# Patient Record
Sex: Female | Born: 1935 | Race: White | Hispanic: No | Smoking: Former smoker
Health system: Southern US, Community
[De-identification: ages and names within clinical notes are randomized; demographics above are authoritative.]

## PROBLEM LIST (undated history)

## (undated) DIAGNOSIS — I10 Essential (primary) hypertension: Secondary | ICD-10-CM

## (undated) DIAGNOSIS — M1990 Unspecified osteoarthritis, unspecified site: Secondary | ICD-10-CM

## (undated) DIAGNOSIS — E039 Hypothyroidism, unspecified: Secondary | ICD-10-CM

## (undated) HISTORY — PX: BACK SURGERY: SHX140

## (undated) HISTORY — PX: RIGHT OOPHORECTOMY: SHX2359

## (undated) HISTORY — PX: HEEL SPUR SURGERY: SHX665

## (undated) HISTORY — PX: EYE SURGERY: SHX253

## (undated) HISTORY — PX: BREAST SURGERY: SHX581

---

## 2006-04-11 ENCOUNTER — Encounter (INDEPENDENT_AMBULATORY_CARE_PROVIDER_SITE_OTHER): Payer: Self-pay | Admitting: Specialist

## 2006-04-12 ENCOUNTER — Inpatient Hospital Stay (HOSPITAL_COMMUNITY): Admission: RE | Admit: 2006-04-12 | Discharge: 2006-04-14 | Payer: Self-pay | Admitting: Orthopedic Surgery

## 2006-04-13 ENCOUNTER — Ambulatory Visit: Payer: Self-pay | Admitting: Physical Medicine & Rehabilitation

## 2009-11-30 ENCOUNTER — Inpatient Hospital Stay (HOSPITAL_COMMUNITY): Admission: RE | Admit: 2009-11-30 | Discharge: 2009-12-01 | Payer: Self-pay | Admitting: Neurosurgery

## 2010-09-06 LAB — SURGICAL PCR SCREEN: Staphylococcus aureus: NEGATIVE

## 2010-09-06 LAB — CBC
HCT: 42.5 % (ref 36.0–46.0)
MCHC: 33.3 g/dL (ref 30.0–36.0)
MCV: 91 fL (ref 78.0–100.0)
Platelets: 191 10*3/uL (ref 150–400)
RDW: 13.3 % (ref 11.5–15.5)
WBC: 7.6 10*3/uL (ref 4.0–10.5)

## 2010-11-05 NOTE — Op Note (Signed)
[name], Olsen                  ACCOUNT NO.:  [id number]   MEDICAL RECORD NO.:  [id number]          PATIENT TYPE:  AMB   LOCATION:  DAY                          FACILITY:  [facility]   PHYSICIAN:  Deidre Ala, M.D.    DATE OF BIRTH:  [birth year]   DATE OF PROCEDURE:  04/11/2006  DATE OF DISCHARGE:                                 OPERATIVE REPORT   PREOPERATIVE DIAGNOSIS:  1. Left heel Haglund's deformity pump bump.  2. Retrocalcaneal bursitis.  3. Insertional tendinitis with tendinosus and large insertional spur.   POSTOPERATIVE DIAGNOSIS:  1. Left heel Haglund's deformity pump bump.  2. Retrocalcaneal bursitis.  3. Insertional tendinitis with tendinosus and large insertional spur.   PROCEDURE:  1. Excision left heel retrocalcaneal bursa and posterior superior      tuberosity os calcis - Haglund's deformity.  2. Excision of intrasubstance tendinosus and insertional tendinitis left      Achilles tendon.  3. Excision large insertional spur left heel.   SURGEON:  1. [name], M.D.   ASSISTANT:  [name], [facility]   ANESTHESIA:  General endotracheal.   CULTURES:  None.   DRAINS:  None.   TOURNIQUET TIME:  1 hour 45 minutes.   PATHOLOGIC FINDINGS AND HISTORY:  Kristen Olsen is a 77 year old female who has had  chronic problems with a large Haglund's deformity, retrocalcaneal bursitis,  insertional tendinitis with a large insertional spur.  She had been treated  with conservative management by orthopedist in [place]. She can not take  nonsteroidal anti-inflammatories. She has a Cam boot at home. The pain was  constant to moderate, worse with walking.  We discussed the procedure which  was described by Dr. Monica Becton, Chairman of Orthopedics at [facility]  for this procedure.  She was fully aware of the operative heal-up time and  the perioperative parameters.  She is an older person, but is quite active  and felt there was no other conservative management to be done.  At  surgery  there was a huge posterior superior tuberosity Haglund's deformity.  There  was a massive insertional osteophyte within the tendon. There was marked  scarring at the insertion of the Achilles tendon as well as about 3 cm up  which we excised.  We did repair with two four-prong Mitek anchors  reinforcing our reinsertion centrally and wove the #2 Ethibond sutures up  the tendon with a locking suture and then oversewed with a #1 PDS to cover  the knots.  We were able to get her to near neutral but about 10 degrees of  plantar flexion, so we put her in a Cam walker boot with hinges and about 10  degrees of flexion.  We did get rid if a lot of scarred insertional and  intrasubstance tendinitis and removed a very inflamed retrocalcaneal bursa.  We debulked the mass of the tendon and spurring quite significantly at the  insertion.   PROCEDURE:  With adequate anesthesia obtained using endotracheal technique,  1 gram vancomycin IV prophylaxis due to penicillin allergy.  The patient was  placed prone  on chest rolls.  After standard prepping and draping of the  left lower extremity from the toes to the knee.  A curvilinear skin incision  was then made with the flap based lateral to protect the sural nerve and to  not have wound over the insertion site. Incision was deepened sharply with  knife and hemostasis obtained using the Bovie electrocoagulator.  Gentle  dissection was carried out to develop the flap thus exposing the Achilles  tendon.  In the manner of Nunley, a longitudinal incision was then made in  the tendon cutting down to its insertion on the heel.  Careful dissection of  the tendon centrally was carried out exposing the insertional spur as well  as the Haglund's deformity and the retrocalcaneal bursitis.  With osteotomes  and rongeur we removed the Haglund's deformity, posterior superior os calcis  and smoothed it. We then removed the insertional spur centrally and somewhat   medially and laterally removing the bony deformity essentially completely.  This was all smoothed with rongeur. Irrigation was carried out.  I then  drilled the os calcis to stimulate neovascularity with 0.62 K-wire.  I then  placed two four-prong Mitek anchors at the os calcis insertional site and  pulled the tendon down to it on both sides weaving it up inside the tendon  so to not expose suture and pulled it down to the heel.  I then oversewed  this with a running #1 PDS to bury any nonabsorbable suture knots and tied  it to itself within the substance of the tendon.  When I was happy with the  secure tendon fixation and closure, irrigation was carried out and wound was  closed in layers with 3-0 and 4-0 Vicryl subcu and skin staples.  Bulky  sterile compressive dressing was applied with the foot in slight plantar  flexion to be later put in PACU to a Cam Walker boot with the hinges  pointing the foot downward.  The patient having procedure well was awakened,  taken to recovery room in satisfactory condition.  She is to spend the night  for elevation, overnight observation, to be discharged with crutches,  elevation, toe touch weightbearing, and told call the office for appointment  for recheck at the first part of next week.           ______________________________  V. [name], M.D.     VEP/MEDQ  D:  04/11/2006  T:  04/12/2006  Job:  [id number]   cc:   Nadine Counts  Fax: [phone]

## 2010-11-05 NOTE — Discharge Summary (Signed)
[name], Kristen Olsen                  ACCOUNT NO.:  [id number]   MEDICAL RECORD NO.:  [id number]          PATIENT TYPE:  INP   LOCATION:  1507                         FACILITY:  [facility]   PHYSICIAN:  Deidre Ala, M.D.    DATE OF BIRTH:  [birth year]   DATE OF ADMISSION:  04/11/2006  DATE OF DISCHARGE:  04/14/2006                                 DISCHARGE SUMMARY   ADMISSION DIAGNOSES:  1. Hypothyroidism.  2. Left heel Haglund's deformity.   DISCHARGE DIAGNOSES:  1. Hypothyroidism.  2. Haglund's deformity status post left heel Nunley procedure with      excision Haglund's deformity at the os calcis and debridement of the      Achilles' tendon and repair and excision of retrocalcaneal bursa.   PROCEDURE:  The patient was taken to the operating room on April 11, 2006  and underwent left heel Nunley procedure with excision of Haglund's  deformity at the os calcis and debridement of the Achilles' tendinitis and  repair and excision of retrocalcaneal bursa.   SURGEON:  1. [name], M.D.   ASSISTANT:  [name], P.A.-C.   CONSULTATIONS:  1. Physical therapy.  2. Occupational therapy.  3. Physical management and rehabilitation with Dr. Thomasena Edis.   BRIEF HISTORY:  The patient is a 77 year old female with a longstanding  history of heel pain.  She has always had deformity of the left heel.  Radiographs confirmed Haglund's deformity of the left heel.  Upon these  findings and failure of conservative treatment, Dr. [name] felt it was best to  proceed with surgery.  The patient agreed.  The risks and benefits of the  surgery were discussed with the patient and the patient wished to proceed.   LABORATORY DATA:  CBC on admission showed hemoglobin 14.4, hematocrit 42.6,  white blood cell count 9.2, red blood cell count 4.71.  Hemoglobin and  hematocrit were followed up after surgery and remained within normal limits.  Differential on admission were all within normal limits.   Coagulation  studies on admission were all within normal limits.  Routine chemistry on  admission showed sodium slightly high at 146, glucose high at 150.  Follow-  up chemistry showed sodium return to normal range and glucose remained high  at 120.  Urinalysis on admission showed appearance cloudy with trace amount  of nitrite, few epithelials and hyaline cast.  EKG on admission showed sinus  rhythm with first degree AV block possible anterior infarct, age  undetermined.  Preop chest x-ray showed cardiomegaly with no failure,  chronic obstructive pulmonary disease and emphysema and growth specimen  showed left retrocalcaneal cyst with benign fibro adipose tissue is patchy  and nonchronic inflammation of a nonsynovial cyst.   HOSPITAL COURSE:  The patient was admitted to [facility] and taken  to the operating room.  She underwent the above stated procedure without  complications.  The patient tolerated the procedure well.  Allowed to return  to the recovery room and orthopedic floor to continue postoperative care.  On postoperative day #1, the patient complained of nausea and  vomiting.  Hemoglobin 10.7, hematocrit 36.9.  She is neurovascularly intact to the left  lower extremity.  The patient was in a CAM boot and was to work with  physical therapy and occupational therapy.  On postoperative day #2, the  patient was unable to maintain nonweight-bearing status as she lives alone.  She is neurovascularly intact to the left lower extremity.  Good distal  pulses, will remain in a Cam boot.  On postoperative day #3, the patient was  resting comfortably.  Incisions clean, dry and intact.  She remained  neurovascularly intact to the left lower extremity.  Skilled nursing  facility placement was searched at this time and will be discharged to a  skilled nursing facility of her choice.   DISPOSITION:  The patient is discharged to a skilled nursing facility of her  choice.   DISCHARGE  MEDICATIONS:  1. Colace 100 mg p.o. b.i.d.  2. Synthroid 88 mcg p.o. q.d.  3. Laxative of choice 1 unit p.o. p.r.n.  4. Enema of choice, 1 unit p.o. p.r.n.  5. Percocet 5/325 mg, 1-2 p.o. q.4-6h. p.r.n. pain.  6. Tylenol 325 mg to 650 mg p.o. q.4-6h. p.r.n.  7. Reglan 10 mg p.o. q.8h. p.r.n.  8. Phenergan 25 mg p.o. q.6h. p.r.n.  9. Robaxin 500 mg p.o. q.6h. p.r.n.  10.Restoril 30 mg p.o. q.h.s. p.r.n.  11.Benadryl 25 mg p.o. q.h.s. p.r.n.   DIET:  As tolerated.   ACTIVITY:  The patient is nonweightbearing to the left lower extremity.   WOUND CARE:  The patient is to have daily dressing changes performed.   FOLLOWUP:  The patient is to follow up with Dr. [name] two weeks from the day  of surgery.  The office is to be called for an appointment at [phone].   CONDITION ON DISCHARGE:  Stable and improved.     ______________________________  [name], P.A.-C.    ______________________________  V. [name], M.D.    SW/MEDQ  D:  04/14/2006  T:  04/14/2006  Job:  [id number]

## 2013-07-16 ENCOUNTER — Other Ambulatory Visit: Payer: Self-pay | Admitting: Neurosurgery

## 2013-07-16 DIAGNOSIS — M545 Low back pain, unspecified: Secondary | ICD-10-CM

## 2013-07-22 ENCOUNTER — Ambulatory Visit
Admission: RE | Admit: 2013-07-22 | Discharge: 2013-07-22 | Disposition: A | Discharge status: Home / Self Care | Payer: Medicare HMO | Source: Ambulatory Visit | Attending: Neurosurgery | Admitting: Neurosurgery

## 2013-07-22 DIAGNOSIS — M545 Low back pain, unspecified: Secondary | ICD-10-CM

## 2013-07-22 MED ORDER — METHYLPREDNISOLONE ACETATE 40 MG/ML INJ SUSP (RADIOLOG
120.0000 mg | Freq: Once | INTRAMUSCULAR | Status: AC
  Administered 2013-07-22: 120 mg via EPIDURAL

## 2013-07-22 MED ORDER — IOHEXOL 180 MG/ML  SOLN
1.0000 mL | Freq: Once | INTRAMUSCULAR | Status: AC | PRN
  Administered 2013-07-22: 1 mL via EPIDURAL

## 2013-07-22 NOTE — Discharge Instructions (Signed)

## 2013-08-22 ENCOUNTER — Other Ambulatory Visit: Payer: Self-pay | Admitting: Neurosurgery

## 2013-08-22 DIAGNOSIS — M545 Low back pain, unspecified: Secondary | ICD-10-CM

## 2013-08-26 ENCOUNTER — Ambulatory Visit
Admission: RE | Admit: 2013-08-26 | Discharge: 2013-08-26 | Disposition: A | Discharge status: Home / Self Care | Payer: Medicare HMO | Source: Ambulatory Visit | Attending: Neurosurgery | Admitting: Neurosurgery

## 2013-08-26 DIAGNOSIS — M545 Low back pain, unspecified: Secondary | ICD-10-CM

## 2013-08-26 MED ORDER — METHYLPREDNISOLONE ACETATE 40 MG/ML INJ SUSP (RADIOLOG
120.0000 mg | Freq: Once | INTRAMUSCULAR | Status: AC
  Administered 2013-08-26: 120 mg via EPIDURAL

## 2013-08-26 MED ORDER — IOHEXOL 180 MG/ML  SOLN
1.0000 mL | Freq: Once | INTRAMUSCULAR | Status: AC | PRN
  Administered 2013-08-26: 1 mL via EPIDURAL

## 2013-10-22 ENCOUNTER — Other Ambulatory Visit: Payer: Self-pay | Admitting: Neurosurgery

## 2013-10-23 ENCOUNTER — Encounter (HOSPITAL_COMMUNITY): Payer: Self-pay | Admitting: Pharmacy Technician

## 2013-10-25 ENCOUNTER — Encounter (HOSPITAL_COMMUNITY)
Admission: RE | Admit: 2013-10-25 | Discharge: 2013-10-25 | Disposition: A | Discharge status: Home / Self Care | Payer: Medicare HMO | Source: Ambulatory Visit | Attending: Neurosurgery | Admitting: Neurosurgery

## 2013-10-25 ENCOUNTER — Encounter (HOSPITAL_COMMUNITY): Payer: Self-pay

## 2013-10-25 ENCOUNTER — Ambulatory Visit (HOSPITAL_COMMUNITY)
Admission: RE | Admit: 2013-10-25 | Discharge: 2013-10-25 | Disposition: A | Discharge status: Home / Self Care | Payer: Medicare HMO | Source: Ambulatory Visit | Attending: Anesthesiology | Admitting: Anesthesiology

## 2013-10-25 DIAGNOSIS — Z01812 Encounter for preprocedural laboratory examination: Secondary | ICD-10-CM | POA: Insufficient documentation

## 2013-10-25 DIAGNOSIS — Z01810 Encounter for preprocedural cardiovascular examination: Secondary | ICD-10-CM | POA: Insufficient documentation

## 2013-10-25 DIAGNOSIS — Z01818 Encounter for other preprocedural examination: Secondary | ICD-10-CM | POA: Insufficient documentation

## 2013-10-25 HISTORY — DX: Essential (primary) hypertension: I10

## 2013-10-25 HISTORY — DX: Hypothyroidism, unspecified: E03.9

## 2013-10-25 HISTORY — DX: Unspecified osteoarthritis, unspecified site: M19.90

## 2013-10-25 LAB — BASIC METABOLIC PANEL
BUN: 13 mg/dL (ref 6–23)
CHLORIDE: 101 meq/L (ref 96–112)
CO2: 25 meq/L (ref 19–32)
Calcium: 9.7 mg/dL (ref 8.4–10.5)
Creatinine, Ser: 0.7 mg/dL (ref 0.50–1.10)
GFR calc Af Amer: 90 mL/min — ABNORMAL LOW (ref >90)
GFR calc non Af Amer: 77 mL/min — ABNORMAL LOW (ref >90)
Glucose, Bld: 88 mg/dL (ref 70–99)
POTASSIUM: 4 meq/L (ref 3.7–5.3)
SODIUM: 138 meq/L (ref 137–147)

## 2013-10-25 LAB — CBC
HEMATOCRIT: 42.8 % (ref 36.0–46.0)
HEMOGLOBIN: 14.3 g/dL (ref 12.0–15.0)
MCH: 30.2 pg (ref 26.0–34.0)
MCHC: 33.4 g/dL (ref 30.0–36.0)
MCV: 90.5 fL (ref 78.0–100.0)
Platelets: 204 10*3/uL (ref 150–400)
RBC: 4.73 MIL/uL (ref 3.87–5.11)
RDW: 13.9 % (ref 11.5–15.5)
WBC: 6 10*3/uL (ref 4.0–10.5)

## 2013-10-25 LAB — SURGICAL PCR SCREEN
MRSA, PCR: NEGATIVE
STAPHYLOCOCCUS AUREUS: NEGATIVE

## 2013-10-25 NOTE — Pre-Procedure Instructions (Signed)
Kristen Olsen  10/25/2013   Your procedure is scheduled on:  Monday, May 11th   [facility] at  9:22 AM.   Call this number if you have problems the morning of surgery: [phone]   Remember:   Do not eat food or drink liquids after midnight Sunday.   Take these medicines the morning of surgery with A SIP OF WATER: Norvasc, Levothyroxine   Do not wear jewelry, make-up or nail polish.  Do not wear lotions, powders, or perfumes. You may NOT wear deodorant.  Do not shave underarms & legs 48 hours prior to surgery.    Do not bring valuables to the hospital.  [facility] is not responsible for any belongings or valuables.               Contacts, dentures or bridgework may not be worn into surgery.  Leave suitcase in the car. After surgery it may be brought to your room.  For patients admitted to the hospital, discharge time is determined by your treatment team.              Name and phone number of your driver:    Special Instructions: "Preparing for Surgery" instruction sheet.   Please read over the following fact sheets that you were given: Pain Booklet, MRSA Information and Surgical Site Infection Prevention

## 2013-10-25 NOTE — Progress Notes (Signed)
Anesthesia Chart Review:  Patient is a 80 year old female scheduled for left L4-5, L5-S1 microdiscectomy on 10/28/13 by Dr. Wynetta Emeryram. PAT was on Friday, chart given to me to review at 4 PM.  History includes former smoker, hypothyroidism, HTN, arthritis, back surgery, breast cyst excision. PCP is listed as Dr. Foye Deerouglas Schultz.  EKG on 10/25/13 showed SR with first degree AVB, PACs, LAD, pulmonary disease pattern, non-specific ST/T wave abnormality.  Poor precordial r wave progression.  Low r waves in inferior leads.  Overall, I think the EKG appears stable when compared to prior tracing on 11/25/09 (which was done prior to a redo laminectomy/microdiskectomy). No CV symptoms were documented from her PAT visit.  Preoperative CXR and labs noted.  Further evaluation by her assigned anesthesiologist on the day of surgery.  I think her EKG appears stable since 2011, so if no acute changes or new CV symptoms then I would anticipate that she could proceed as planned.  Velna Ochsllison Roosvelt Churchwell, PA-C [facility]/Anesthesiology Phone [phone] 10/25/2013 4:06 PM

## 2013-10-25 NOTE — Progress Notes (Signed)
Ms. Kristen StallingMarley maybe 'sensitive' to the chlorhexadine.  She stated that she cannot use perfume products, lotion, soap, etc as her skin will turn red.  I advised her that when she uses the soap to clean off, be extra attentive to, if any 'side effects' from the soap.  A trial run was done here in the PAT room...knuckles got alittle red, but no hives or rash, or itching noted.   DA

## 2013-10-28 ENCOUNTER — Encounter (HOSPITAL_COMMUNITY)
Admission: RE | Disposition: A | Discharge status: Home / Self Care | Payer: Self-pay | Source: Ambulatory Visit | Attending: Neurosurgery

## 2013-10-28 ENCOUNTER — Inpatient Hospital Stay (HOSPITAL_COMMUNITY)
Admission: RE | Admit: 2013-10-28 | Discharge: 2013-10-29 | DRG: 520 | Disposition: A | Discharge status: Home / Self Care | Payer: Medicare HMO | Source: Ambulatory Visit | Attending: Neurosurgery | Admitting: Neurosurgery

## 2013-10-28 ENCOUNTER — Encounter (HOSPITAL_COMMUNITY): Payer: Medicare HMO | Admitting: Vascular Surgery

## 2013-10-28 ENCOUNTER — Inpatient Hospital Stay (HOSPITAL_COMMUNITY): Payer: Medicare HMO

## 2013-10-28 ENCOUNTER — Encounter (HOSPITAL_COMMUNITY): Payer: Self-pay | Admitting: Anesthesiology

## 2013-10-28 ENCOUNTER — Inpatient Hospital Stay (HOSPITAL_COMMUNITY): Payer: Medicare HMO | Admitting: Anesthesiology

## 2013-10-28 DIAGNOSIS — Z79899 Other long term (current) drug therapy: Secondary | ICD-10-CM

## 2013-10-28 DIAGNOSIS — E039 Hypothyroidism, unspecified: Secondary | ICD-10-CM | POA: Diagnosis present

## 2013-10-28 DIAGNOSIS — M129 Arthropathy, unspecified: Secondary | ICD-10-CM | POA: Diagnosis present

## 2013-10-28 DIAGNOSIS — M5126 Other intervertebral disc displacement, lumbar region: Principal | ICD-10-CM | POA: Diagnosis present

## 2013-10-28 DIAGNOSIS — I10 Essential (primary) hypertension: Secondary | ICD-10-CM | POA: Diagnosis present

## 2013-10-28 DIAGNOSIS — Z87891 Personal history of nicotine dependence: Secondary | ICD-10-CM

## 2013-10-28 DIAGNOSIS — M48061 Spinal stenosis, lumbar region without neurogenic claudication: Secondary | ICD-10-CM | POA: Diagnosis present

## 2013-10-28 HISTORY — PX: LUMBAR LAMINECTOMY/DECOMPRESSION MICRODISCECTOMY: SHX5026

## 2013-10-28 SURGERY — LUMBAR LAMINECTOMY/DECOMPRESSION MICRODISCECTOMY 2 LEVELS
Anesthesia: General | Site: Back | Laterality: Left

## 2013-10-28 MED ORDER — ARTIFICIAL TEARS OP OINT
TOPICAL_OINTMENT | OPHTHALMIC | Status: DC | PRN
  Administered 2013-10-28: 1 via OPHTHALMIC

## 2013-10-28 MED ORDER — GLYCOPYRROLATE 0.2 MG/ML IJ SOLN
INTRAMUSCULAR | Status: AC
  Filled 2013-10-28: qty 3

## 2013-10-28 MED ORDER — PHENOL 1.4 % MT LIQD: 1.0000 | OROMUCOSAL | Status: DC | PRN

## 2013-10-28 MED ORDER — BUPIVACAINE HCL (PF) 0.25 % IJ SOLN
INTRAMUSCULAR | Status: DC | PRN
  Administered 2013-10-28: 10 mL

## 2013-10-28 MED ORDER — LACTATED RINGERS IV SOLN
INTRAVENOUS | Status: DC | PRN
  Administered 2013-10-28: 07:00:00 via INTRAVENOUS

## 2013-10-28 MED ORDER — FENTANYL CITRATE 0.05 MG/ML IJ SOLN
INTRAMUSCULAR | Status: AC
  Filled 2013-10-28: qty 5

## 2013-10-28 MED ORDER — THROMBIN 5000 UNITS EX SOLR
CUTANEOUS | Status: DC | PRN
  Administered 2013-10-28 (×2): 5000 [IU] via TOPICAL

## 2013-10-28 MED ORDER — LIDOCAINE HCL (CARDIAC) 20 MG/ML IV SOLN
INTRAVENOUS | Status: DC | PRN
Start: 2013-10-28 — End: 2013-10-28
  Administered 2013-10-28: 100 mg via INTRAVENOUS

## 2013-10-28 MED ORDER — HAIR/SKIN/NAILS/BIOTIN PO TABS: 1.0000 | ORAL_TABLET | Freq: Every day | ORAL | Status: DC

## 2013-10-28 MED ORDER — MENTHOL 3 MG MT LOZG
1.0000 | LOZENGE | OROMUCOSAL | Status: DC | PRN
  Administered 2013-10-28: 3 mg via ORAL
  Filled 2013-10-28: qty 9

## 2013-10-28 MED ORDER — VANCOMYCIN HCL 10 G IV SOLR
1250.0000 mg | Freq: Once | INTRAVENOUS | Status: AC
  Administered 2013-10-28: 1250 mg via INTRAVENOUS
  Filled 2013-10-28: qty 1250

## 2013-10-28 MED ORDER — SODIUM CHLORIDE 0.9 % IJ SOLN: 3.0000 mL | INTRAMUSCULAR | Status: DC | PRN

## 2013-10-28 MED ORDER — LIDOCAINE-EPINEPHRINE 1 %-1:100000 IJ SOLN
INTRAMUSCULAR | Status: DC | PRN
  Administered 2013-10-28: 9 mL

## 2013-10-28 MED ORDER — ONDANSETRON HCL 4 MG/2ML IJ SOLN
INTRAMUSCULAR | Status: DC | PRN
  Administered 2013-10-28: 4 mg via INTRAVENOUS

## 2013-10-28 MED ORDER — ROCURONIUM BROMIDE 100 MG/10ML IV SOLN
INTRAVENOUS | Status: DC | PRN
  Administered 2013-10-28: 10 mg via INTRAVENOUS
  Administered 2013-10-28: 40 mg via INTRAVENOUS

## 2013-10-28 MED ORDER — MIDAZOLAM HCL 5 MG/5ML IJ SOLN
INTRAMUSCULAR | Status: DC | PRN
  Administered 2013-10-28: 1 mg via INTRAVENOUS

## 2013-10-28 MED ORDER — HYDROMORPHONE HCL PF 1 MG/ML IJ SOLN: 0.5000 mg | INTRAMUSCULAR | Status: DC | PRN

## 2013-10-28 MED ORDER — ONDANSETRON HCL 4 MG/2ML IJ SOLN
INTRAMUSCULAR | Status: AC
  Filled 2013-10-28: qty 2

## 2013-10-28 MED ORDER — LIDOCAINE HCL (CARDIAC) 20 MG/ML IV SOLN
INTRAVENOUS | Status: AC
Start: 2013-10-28 — End: 2013-10-28
  Filled 2013-10-28: qty 5

## 2013-10-28 MED ORDER — SUCCINYLCHOLINE CHLORIDE 20 MG/ML IJ SOLN
INTRAMUSCULAR | Status: AC
  Filled 2013-10-28: qty 1

## 2013-10-28 MED ORDER — HEMOSTATIC AGENTS (NO CHARGE) OPTIME
TOPICAL | Status: DC | PRN
  Administered 2013-10-28: 1 via TOPICAL

## 2013-10-28 MED ORDER — PHENYLEPHRINE HCL 10 MG/ML IJ SOLN
INTRAMUSCULAR | Status: DC | PRN
  Administered 2013-10-28: 40 ug via INTRAVENOUS

## 2013-10-28 MED ORDER — AMLODIPINE BESYLATE 5 MG PO TABS
5.0000 mg | ORAL_TABLET | Freq: Every day | ORAL | Status: DC
  Administered 2013-10-29: 5 mg via ORAL
  Filled 2013-10-28: qty 1

## 2013-10-28 MED ORDER — ACETAMINOPHEN 500 MG PO TABS
1000.0000 mg | ORAL_TABLET | Freq: Every day | ORAL | Status: DC
  Administered 2013-10-28 – 2013-10-29 (×2): 1000 mg via ORAL
  Filled 2013-10-28 (×2): qty 2

## 2013-10-28 MED ORDER — NEOSTIGMINE METHYLSULFATE 10 MG/10ML IV SOLN
INTRAVENOUS | Status: AC
  Filled 2013-10-28: qty 1

## 2013-10-28 MED ORDER — NEOSTIGMINE METHYLSULFATE 10 MG/10ML IV SOLN
INTRAVENOUS | Status: DC | PRN
  Administered 2013-10-28: 1 mg via INTRAVENOUS
  Administered 2013-10-28: 4 mg via INTRAVENOUS

## 2013-10-28 MED ORDER — CYCLOBENZAPRINE HCL 10 MG PO TABS: 10.0000 mg | ORAL_TABLET | Freq: Three times a day (TID) | ORAL | Status: DC | PRN

## 2013-10-28 MED ORDER — 0.9 % SODIUM CHLORIDE (POUR BTL) OPTIME
TOPICAL | Status: DC | PRN
  Administered 2013-10-28: 1000 mL

## 2013-10-28 MED ORDER — VANCOMYCIN HCL IN DEXTROSE 1-5 GM/200ML-% IV SOLN
INTRAVENOUS | Status: AC
  Administered 2013-10-28: 1000 mg via INTRAVENOUS
  Filled 2013-10-28: qty 200

## 2013-10-28 MED ORDER — SODIUM CHLORIDE 0.9 % IR SOLN
Status: DC | PRN
  Administered 2013-10-28: 08:00:00

## 2013-10-28 MED ORDER — FENTANYL CITRATE 0.05 MG/ML IJ SOLN: 25.0000 ug | INTRAMUSCULAR | Status: DC | PRN

## 2013-10-28 MED ORDER — DOCUSATE SODIUM 100 MG PO CAPS
100.0000 mg | ORAL_CAPSULE | Freq: Two times a day (BID) | ORAL | Status: DC
  Administered 2013-10-28 – 2013-10-29 (×2): 100 mg via ORAL
  Filled 2013-10-28 (×4): qty 1

## 2013-10-28 MED ORDER — GLYCOPYRROLATE 0.2 MG/ML IJ SOLN
INTRAMUSCULAR | Status: DC | PRN
  Administered 2013-10-28: 0.2 mg via INTRAVENOUS
  Administered 2013-10-28: .6 mg via INTRAVENOUS

## 2013-10-28 MED ORDER — MIDAZOLAM HCL 2 MG/2ML IJ SOLN
INTRAMUSCULAR | Status: AC
  Filled 2013-10-28: qty 2

## 2013-10-28 MED ORDER — FENTANYL CITRATE 0.05 MG/ML IJ SOLN
INTRAMUSCULAR | Status: DC | PRN
Start: 2013-10-28 — End: 2013-10-28
  Administered 2013-10-28: 50 ug via INTRAVENOUS
  Administered 2013-10-28: 25 ug via INTRAVENOUS

## 2013-10-28 MED ORDER — ONDANSETRON HCL 4 MG/2ML IJ SOLN: 4.0000 mg | INTRAMUSCULAR | Status: DC | PRN

## 2013-10-28 MED ORDER — ACETAMINOPHEN 650 MG RE SUPP: 650.0000 mg | RECTAL | Status: DC | PRN

## 2013-10-28 MED ORDER — LEVOTHYROXINE SODIUM 88 MCG PO TABS
88.0000 ug | ORAL_TABLET | Freq: Every day | ORAL | Status: DC
  Administered 2013-10-29: 88 ug via ORAL
  Filled 2013-10-28 (×2): qty 1

## 2013-10-28 MED ORDER — EPHEDRINE SULFATE 50 MG/ML IJ SOLN
INTRAMUSCULAR | Status: DC | PRN
  Administered 2013-10-28 (×2): 10 mg via INTRAVENOUS

## 2013-10-28 MED ORDER — CEFAZOLIN SODIUM 1-5 GM-% IV SOLN
1.0000 g | Freq: Three times a day (TID) | INTRAVENOUS | Status: DC
Start: 2013-10-28 — End: 2013-10-28

## 2013-10-28 MED ORDER — ACETAMINOPHEN 325 MG PO TABS
650.0000 mg | ORAL_TABLET | ORAL | Status: DC | PRN
  Administered 2013-10-28: 650 mg via ORAL
  Filled 2013-10-28: qty 2

## 2013-10-28 MED ORDER — PROPOFOL 10 MG/ML IV BOLUS
INTRAVENOUS | Status: AC
  Filled 2013-10-28: qty 20

## 2013-10-28 MED ORDER — SODIUM CHLORIDE 0.9 % IV SOLN: 250.0000 mL | INTRAVENOUS | Status: DC

## 2013-10-28 MED ORDER — ROCURONIUM BROMIDE 50 MG/5ML IV SOLN
INTRAVENOUS | Status: AC
  Filled 2013-10-28: qty 1

## 2013-10-28 MED ORDER — EPHEDRINE SULFATE 50 MG/ML IJ SOLN
INTRAMUSCULAR | Status: AC
  Filled 2013-10-28: qty 1

## 2013-10-28 MED ORDER — HYDROCODONE-ACETAMINOPHEN 5-325 MG PO TABS: 1.0000 | ORAL_TABLET | ORAL | Status: DC | PRN

## 2013-10-28 MED ORDER — PROPOFOL 10 MG/ML IV BOLUS
INTRAVENOUS | Status: DC | PRN
  Administered 2013-10-28: 130 mg via INTRAVENOUS

## 2013-10-28 MED ORDER — ALUM & MAG HYDROXIDE-SIMETH 200-200-20 MG/5ML PO SUSP: 30.0000 mL | Freq: Four times a day (QID) | ORAL | Status: DC | PRN

## 2013-10-28 MED ORDER — SODIUM CHLORIDE 0.9 % IJ SOLN
3.0000 mL | Freq: Two times a day (BID) | INTRAMUSCULAR | Status: DC
  Administered 2013-10-28 (×2): 3 mL via INTRAVENOUS

## 2013-10-28 SURGICAL SUPPLY — 64 items
ADH SKN CLS APL DERMABOND .7 (GAUZE/BANDAGES/DRESSINGS) ×1
APL SKNCLS STERI-STRIP NONHPOA (GAUZE/BANDAGES/DRESSINGS) ×1
BAG DECANTER FOR FLEXI CONT (MISCELLANEOUS) ×2 IMPLANT
BENZOIN TINCTURE PRP APPL 2/3 (GAUZE/BANDAGES/DRESSINGS) ×2 IMPLANT
BLADE 10 SAFETY STRL DISP (BLADE) ×1 IMPLANT
BLADE SURG 11 STRL SS (BLADE) ×2 IMPLANT
BLADE SURG ROTATE 9660 (MISCELLANEOUS) IMPLANT
BRUSH SCRUB EZ PLAIN DRY (MISCELLANEOUS) ×2 IMPLANT
BUR MATCHSTICK NEURO 3.0 LAGG (BURR) ×2 IMPLANT
BUR PRECISION FLUTE 6.0 (BURR) ×2 IMPLANT
CANISTER SUCT 3000ML (MISCELLANEOUS) ×2 IMPLANT
CONT SPEC 4OZ CLIKSEAL STRL BL (MISCELLANEOUS) ×2 IMPLANT
DECANTER SPIKE VIAL GLASS SM (MISCELLANEOUS) ×1 IMPLANT
DERMABOND ADVANCED (GAUZE/BANDAGES/DRESSINGS) ×1
DERMABOND ADVANCED .7 DNX12 (GAUZE/BANDAGES/DRESSINGS) ×1 IMPLANT
DRAPE LAPAROTOMY 100X72X124 (DRAPES) ×2 IMPLANT
DRAPE MICROSCOPE ZEISS OPMI (DRAPES) ×2 IMPLANT
DRAPE POUCH INSTRU U-SHP 10X18 (DRAPES) ×2 IMPLANT
DRAPE PROXIMA HALF (DRAPES) IMPLANT
DRAPE SURG 17X23 STRL (DRAPES) ×2 IMPLANT
DRSG OPSITE 4X5.5 SM (GAUZE/BANDAGES/DRESSINGS) ×1 IMPLANT
DRSG OPSITE POSTOP 3X4 (GAUZE/BANDAGES/DRESSINGS) ×1 IMPLANT
DURAPREP 26ML APPLICATOR (WOUND CARE) ×2 IMPLANT
ELECT REM PT RETURN 9FT ADLT (ELECTROSURGICAL) ×2
ELECTRODE REM PT RTRN 9FT ADLT (ELECTROSURGICAL) ×1 IMPLANT
GAUZE SPONGE 4X4 16PLY XRAY LF (GAUZE/BANDAGES/DRESSINGS) IMPLANT
GLOVE BIO SURGEON STRL SZ8 (GLOVE) ×2 IMPLANT
GLOVE BIOGEL PI IND STRL 7.0 (GLOVE) IMPLANT
GLOVE BIOGEL PI IND STRL 8 (GLOVE) IMPLANT
GLOVE BIOGEL PI INDICATOR 7.0 (GLOVE) ×1
GLOVE BIOGEL PI INDICATOR 8 (GLOVE) ×1
GLOVE ECLIPSE 7.5 STRL STRAW (GLOVE) ×1 IMPLANT
GLOVE EXAM NITRILE LRG STRL (GLOVE) IMPLANT
GLOVE EXAM NITRILE MD LF STRL (GLOVE) ×1 IMPLANT
GLOVE EXAM NITRILE XL STR (GLOVE) IMPLANT
GLOVE EXAM NITRILE XS STR PU (GLOVE) IMPLANT
GLOVE INDICATOR 8.5 STRL (GLOVE) ×2 IMPLANT
GLOVE SURG SS PI 6.5 STRL IVOR (GLOVE) ×2 IMPLANT
GOWN BRE IMP SLV AUR LG STRL (GOWN DISPOSABLE) ×1 IMPLANT
GOWN BRE IMP SLV AUR XL STRL (GOWN DISPOSABLE) ×2 IMPLANT
GOWN STRL REIN 2XL LVL4 (GOWN DISPOSABLE) IMPLANT
GOWN STRL REUS W/ TWL LRG LVL3 (GOWN DISPOSABLE) IMPLANT
GOWN STRL REUS W/ TWL XL LVL3 (GOWN DISPOSABLE) IMPLANT
GOWN STRL REUS W/TWL LRG LVL3 (GOWN DISPOSABLE) ×2
GOWN STRL REUS W/TWL XL LVL3 (GOWN DISPOSABLE) ×4
KIT BASIN OR (CUSTOM PROCEDURE TRAY) ×2 IMPLANT
KIT ROOM TURNOVER OR (KITS) ×2 IMPLANT
NDL SPNL 22GX3.5 QUINCKE BK (NEEDLE) ×1 IMPLANT
NEEDLE HYPO 22GX1.5 SAFETY (NEEDLE) ×2 IMPLANT
NEEDLE SPNL 22GX3.5 QUINCKE BK (NEEDLE) ×2 IMPLANT
NS IRRIG 1000ML POUR BTL (IV SOLUTION) ×2 IMPLANT
PACK LAMINECTOMY NEURO (CUSTOM PROCEDURE TRAY) ×2 IMPLANT
RUBBERBAND STERILE (MISCELLANEOUS) ×4 IMPLANT
SPONGE GAUZE 4X4 12PLY (GAUZE/BANDAGES/DRESSINGS) ×1 IMPLANT
SPONGE SURGIFOAM ABS GEL SZ50 (HEMOSTASIS) ×2 IMPLANT
STRIP CLOSURE SKIN 1/2X4 (GAUZE/BANDAGES/DRESSINGS) ×2 IMPLANT
SUT VIC AB 0 CT1 18XCR BRD8 (SUTURE) ×1 IMPLANT
SUT VIC AB 0 CT1 8-18 (SUTURE) ×2
SUT VIC AB 2-0 CT1 18 (SUTURE) ×2 IMPLANT
SUT VICRYL 4-0 PS2 18IN ABS (SUTURE) ×2 IMPLANT
SYR 20ML ECCENTRIC (SYRINGE) ×2 IMPLANT
TOWEL OR 17X24 6PK STRL BLUE (TOWEL DISPOSABLE) ×2 IMPLANT
TOWEL OR 17X26 10 PK STRL BLUE (TOWEL DISPOSABLE) ×2 IMPLANT
WATER STERILE IRR 1000ML POUR (IV SOLUTION) ×2 IMPLANT

## 2013-10-28 NOTE — Transfer of Care (Signed)
Immediate Anesthesia Transfer of Care Note  Patient: Diannia RuderLou K Feldhaus  Procedure(s) Performed: Procedure(s) with comments: LUMBAR LAMINECTOMY/DECOMPRESSION MICRODISCECTOMY 2 LEVELS (Left) - Lumbar Four-Five Decompression and Lumbar Five-Sacral One Decompression and Diskectomy  Patient Location: PACU  Anesthesia Type:General  Level of Consciousness: awake and alert   Airway & Oxygen Therapy: Patient Spontanous Breathing and Patient connected to nasal cannula oxygen  Post-op Assessment: Report given to PACU RN and Post -op Vital signs reviewed and stable  Post vital signs: Reviewed and stable  Complications: No apparent anesthesia complications

## 2013-10-28 NOTE — Op Note (Signed)
Preoperative diagnosis: And a left L5-S1 radiculopathy from lumbar spinal stenosis and herniated nucleus pulposus L5-S1 left  Postoperative diagnosis: Same  Procedure: Decompressive lumbar laminectomy L5-S1 with microdissection of the S1 nerve root microscopic discectomy L5-S1 with extension of the laminotomy of the inferior aspect of the L4-5 disc space and foraminotomy a left L4 nerve root  Surgeon: Jillyn HiddenGary Aeron Lheureux  Assistant: Shirlean Kellyobert Nudelman  Anesthesia: Gen.  EBL: Minimal  History of present illness: Patient is a 80 year female is a progress worsening back and left leg pain rate in the back or leg back of her calf heel into the S1 or foot and the third and fourth toes. Spell to be consistent with an S1 radiculopathy however she did have some stenosis in the inferior aspect of the 4-5 disc space on the 5 root so therefore recommended a laminectomy and discectomy at L5-S1 with extension up and decompression of the interest of the L5 nerve root. Patient failed all forms service who with anti-inflammatories narcotic pain management physical therapy and epidural steroid injections.  I extensively went over the risks and benefits of the operation the patient as well as perioperative course and expectations of outcome and alternatives to surgery and she understood and agreed to proceed forward.  Operative procedure: Patient brought into the or was induced under general anesthesia positioned prone the Wilson frame her back was prepped and draped in routine sterile fashion her old incision was opened up and dissection was carried down on the left side of the lamina of L4-5 and S1 interoperative x-ray confirmed identification appropriate level so than bursal the entire lamina of L5 was drilled down a high-speed drill the inferior aspect of the 45 facet and the super aspect of the L5-S1 facet as well as super aspect the S1 lamina. Then using a 3 minute Kerrison punch complete laminectomies and left-sided L5-S1  extension up into the 45 disc space and down with removal of the superior aspect lamina of S1 be a facetectomy was performed and then under microscopic illumination the S1 pedicle was identified the S1 nerve root was unroofed removing a large spur coming off the medial aspect of the facet complex causing dorsal compression of the S1 nerve root. The ligament was a markedly hypertrophied this was also removed piecemeal fashion this helped complete the foraminotomy of S1 margins superiorly to marked subcutaneous just above the L5 pedicle identified the L5 nerve root and decompressed spur coming off the superior aspect of the facet complex at L5-S1 the interest of L4-5 and completed the foraminotomies L5. At this point and the rest of illumination after menisci to the disc space at L5-S1 this was a markedly bulging and displacing the ventral aspect the S1 nerve root so after appear for veins are coagulated the annulotomy was made with lumbar scalpel the spaces and cleanout pituitary rongeurs and Epstein curettes and meniscectomies were further stenosis both foramina were widely patent and easily excepting a coronary.and hockey-stick. Meticulous in a stasis was maintained the scope was irrigated and the wounds closed in layers with after Vicryl and the skin was closed with a running 4 subcuticular benzoin and Steri-Strips were applied as well as Dermabond patient recovered in stable condition. At the end of the case all needle counts and sponge counts were correct the

## 2013-10-28 NOTE — Plan of Care (Signed)
Problem: Consults Goal: Diagnosis - Spinal Surgery Outcome: Completed/Met Date Met:  10/28/13 Lumbar Laminectomy (Complex)

## 2013-10-28 NOTE — Anesthesia Postprocedure Evaluation (Signed)
  Anesthesia Post-op Note  Patient: Kristen Olsen  Procedure(s) Performed: Procedure(s) with comments: LUMBAR LAMINECTOMY/DECOMPRESSION MICRODISCECTOMY 2 LEVELS (Left) - Lumbar Four-Five Decompression and Lumbar Five-Sacral One Decompression and Diskectomy  Patient Location: PACU  Anesthesia Type:General  Level of Consciousness: awake  Airway and Oxygen Therapy: Patient Spontanous Breathing  Post-op Pain: mild  Post-op Assessment: Post-op Vital signs reviewed   Post-op Vital Signs: Reviewed  Last Vitals:  Filed Vitals:   10/28/13 0923  BP: 102/33  Pulse: 59  Temp:   Resp: 16    Complications: No apparent anesthesia complications

## 2013-10-28 NOTE — Anesthesia Preprocedure Evaluation (Addendum)
Anesthesia Evaluation  Patient identified by MRN, date of birth, ID band Patient awake    Reviewed: Allergy & Precautions, H&P , NPO status , Patient's Chart, lab work & pertinent test results  Airway Mallampati: II TM Distance: >3 FB Neck ROM: Full    Dental  (+) Teeth Intact, Dental Advisory Given   Pulmonary former smoker,          Cardiovascular hypertension, Pt. on medications     Neuro/Psych    GI/Hepatic negative GI ROS, Neg liver ROS,   Endo/Other  Hypothyroidism   Renal/GU negative Renal ROS     Musculoskeletal   Abdominal   Peds  Hematology   Anesthesia Other Findings   Reproductive/Obstetrics                          Anesthesia Physical Anesthesia Plan  ASA: III  Anesthesia Plan: General   Post-op Pain Management:    Induction: Intravenous  Airway Management Planned: Oral ETT  Additional Equipment:   Intra-op Plan:   Post-operative Plan: Extubation in OR  Informed Consent: I have reviewed the patients History and Physical, chart, labs and discussed the procedure including the risks, benefits and alternatives for the proposed anesthesia with the patient or authorized representative who has indicated his/her understanding and acceptance.   Dental advisory given  Plan Discussed with: CRNA, Anesthesiologist and Surgeon  Anesthesia Plan Comments:         Anesthesia Quick Evaluation

## 2013-10-28 NOTE — H&P (Signed)
Kristen RuderLou K Trela is an 80 y.o. female.   Chief Complaint: Back and left leg pain HPI: Patient is a very pleasant 80 year old female is a previous laminotomies on the right L4-5 and L5-S1 who has done very well however last several months of progressive worsening left leg pain rating down and predominantly what seems like an S1 distribution back of her leg back or Outside of her foot and the third and fourth toes. She denies any pain and top big toe she does have numbness tingling and same distribution. Workup has shown foraminal stenosis at L4-5 and L5-S1 she been through physical therapy epidural steroid injections and has had limited temporary success from all that. Due to her progression of clinical syndrome and imaging findings today conservative treatment I recommended laminectomy discectomy at L5-S1 as well as extension up to the inferior aspect the L4-5 disc space to decompress the proximal L5 nerve root. I have extensively reviewed the risks and benefits of the operation the patient as well as perioperative course and expectations of outcome and alternatives of surgery she understands and agrees to proceed forward.  Past Medical History  Diagnosis Date  . Hypothyroidism   . Hypertension   . Arthritis     Past Surgical History  Procedure Laterality Date  . Back surgery      x 2  . Right oophorectomy    . Breast surgery      right breast cysts removed  . Eye surgery      bil  cataracts  . Heel spur surgery      LEFT HEEL    No family history on file. Social History:  reports that she quit smoking about 30 years ago. Her smoking use included Cigarettes. She has a 30.75 pack-year smoking history. She has never used smokeless tobacco. She reports that she does not drink alcohol or use illicit drugs.  Allergies:  Allergies  Allergen Reactions  . Penicillins Rash    Medications Prior to Admission  Medication Sig Dispense Refill  . acetaminophen (TYLENOL) 500 MG tablet Take 1,000 mg by  mouth daily. scheduled      . amLODipine (NORVASC) 5 MG tablet Take 5 mg by mouth daily.      Marland Kitchen. levothyroxine (SYNTHROID, LEVOTHROID) 88 MCG tablet Take 88 mcg by mouth daily before breakfast.      . Multiple Vitamins-Minerals (HAIR/SKIN/NAILS/BIOTIN) TABS Take 1 tablet by mouth daily.        No results found for this or any previous visit (from the past 48 hour(s)). No results found.  Review of Systems  Constitutional: Negative.   HENT: Negative.   Eyes: Negative.   Respiratory: Negative.   Cardiovascular: Negative.   Gastrointestinal: Negative.   Genitourinary: Negative.   Musculoskeletal: Positive for back pain, joint pain and myalgias.  Skin: Negative.   Neurological: Positive for tingling and sensory change.  Endo/Heme/Allergies: Negative.   Psychiatric/Behavioral: Negative.     Blood pressure 153/73, pulse 67, temperature 97.6 F (36.4 C), temperature source Oral, resp. rate 18, weight 78.019 kg (172 lb), SpO2 98.00%. Physical Exam  Constitutional: She is oriented to person, place, and time. She appears well-developed and well-nourished.  HENT:  Head: Normocephalic and atraumatic.  Eyes: Pupils are equal, round, and reactive to light.  Neck: Normal range of motion.  Cardiovascular: Normal rate.   Respiratory: Effort normal.  GI: Soft.  Musculoskeletal: Normal range of motion.  Neurological: She is alert and oriented to person, place, and time. She has normal strength.  GCS eye subscore is 4. GCS verbal subscore is 5. GCS motor subscore is 6.  Reflex Scores:      Patellar reflexes are 0 on the right side and 0 on the left side.      Achilles reflexes are 0 on the right side and 0 on the left side. Strength is 5 out of 5 in her iliopsoas, quads, hamstrings, gastrocs, and tibialis, and EHL.  Skin: Skin is warm and dry.     Assessment/Plan 86 year ago and presents for an L4-5 L5-S1 decompressive laminectomy and discectomy respectively  Mariam DollarGary P Clessie Karras 10/28/2013, 7:12  AM

## 2013-10-29 ENCOUNTER — Encounter (HOSPITAL_COMMUNITY): Payer: Self-pay | Admitting: Neurosurgery

## 2013-10-29 NOTE — Progress Notes (Signed)
Patient ID: Kristen Olsen, female   DOB: 07/20/1926, 80 y.o.   MRN: [id number] Doing well no leg pain voiding and ambulating  Strength out of 5 wound clean and dry  Discharge home

## 2013-10-29 NOTE — Discharge Summary (Signed)
  Physician Discharge Summary  Patient ID: Kristen Olsen MRN: [id number] DOB/AGE: 78/08/1926 78 y.o.  Admit date: 10/28/2013 Discharge date: 10/29/2013  Admission Diagnoses: Left L5-S1 radiculopathy from herniated nucleus pulposus and lumbar spinal stenosis L5-S1 and L4-5  Discharge Diagnoses: Same Active Problems:   HNP (herniated nucleus pulposus), lumbar   Discharged Condition: good  Hospital Course: Patient was admitted to the hospital underwent a decompressive laminectomy at L5-S1 and microdiscectomy L5-S1 and decompression L4-5 postoperatively are well recovered in the floor on the floor she was ambulating voiding spontaneously tolerating regular diet and was stable for discharge  Consults: Significant Diagnostic Studies: Treatments: Decompressive laminectomy and discectomy L5-S1 lumbar decompression L4-5 Discharge Exam: Blood pressure 93/46, pulse 66, temperature 99 F (37.2 C), temperature source Oral, resp. rate 18, height 5' 4.57" (1.64 m), weight 78 kg (171 lb 15.3 oz), SpO2 93.00%. Strength out of 5 wound clean and dry  Disposition: Home     Medication List         acetaminophen 500 MG tablet  Commonly known as:  TYLENOL  Take 1,000 mg by mouth daily. scheduled     amLODipine 5 MG tablet  Commonly known as:  NORVASC  Take 5 mg by mouth daily.     HAIR/SKIN/NAILS/BIOTIN Tabs  Take 1 tablet by mouth daily.     levothyroxine 88 MCG tablet  Commonly known as:  SYNTHROID, LEVOTHROID  Take 88 mcg by mouth daily before breakfast.           Follow-up Information   Follow up with [facility],[name], MD.   Specialty:  Neurosurgery   Contact information:   [address] [place] [id number] [phone]       Signed: Mariam DollarGary P Brittie Whisnant 10/29/2013, 7:34 AM

## 2013-10-29 NOTE — Progress Notes (Signed)
Pt. Alert and oriented,follows simple instructions, denies pain. Incision area without swelling, redness or S/S of infection. Voiding adequate clear yellow urine. Moving all extremities well and vitals stable and documented. Patient discharged home with family.  Lumbar surgery notes instructions given to patient and family member for home safety and precautions. Pt. and family stated understanding of instructions given 

## 2013-10-29 NOTE — Discharge Instructions (Signed)
Wound Care  Keep the incision clean and dry. May remove the outer dressing in a couple days. Cover the Steri-Strips with something like Saran wrap to keep dry during showers. Do not put any creams, lotions, or ointments on incision. Leave steri-strips on back.  They will fall off by themselves.  Activity Walk each and every day, increasing distance each day. No lifting greater than 5 lbs.  No lifting, no bending, no twisting, no driving, or riding a car unless coming back and forth to see me.  Diet Resume your normal diet.   Return to Work Will be discussed at you follow up appointment.  Call Your Doctor If Any of These Occur Redness, drainage, or swelling at the wound.  Temperature greater than 101 degrees. Severe pain not relieved by pain medication. Incision starts to come apart. Follow Up Appt Call today for appointment in 1-2 weeks [phone] or for problems.  If you have any hardware placed in your spine, you will need an x-ray before your appointment.

## 2013-12-13 NOTE — OR Nursing (Signed)
Addendum to scope page 

## 2015-03-23 DIAGNOSIS — R5381 Other malaise: Secondary | ICD-10-CM | POA: Diagnosis not present

## 2015-03-27 DIAGNOSIS — I358 Other nonrheumatic aortic valve disorders: Secondary | ICD-10-CM | POA: Diagnosis not present

## 2015-03-27 DIAGNOSIS — I340 Nonrheumatic mitral (valve) insufficiency: Secondary | ICD-10-CM | POA: Diagnosis not present

## 2015-03-27 DIAGNOSIS — R0609 Other forms of dyspnea: Secondary | ICD-10-CM | POA: Diagnosis not present

## 2015-03-30 DIAGNOSIS — I498 Other specified cardiac arrhythmias: Secondary | ICD-10-CM | POA: Diagnosis not present

## 2015-03-30 DIAGNOSIS — R9431 Abnormal electrocardiogram [ECG] [EKG]: Secondary | ICD-10-CM | POA: Diagnosis not present

## 2015-05-22 DIAGNOSIS — Z6826 Body mass index (BMI) 26.0-26.9, adult: Secondary | ICD-10-CM | POA: Diagnosis not present

## 2015-05-22 DIAGNOSIS — Z9181 History of falling: Secondary | ICD-10-CM | POA: Diagnosis not present

## 2015-05-22 DIAGNOSIS — M25512 Pain in left shoulder: Secondary | ICD-10-CM | POA: Diagnosis not present

## 2015-05-22 DIAGNOSIS — Z1389 Encounter for screening for other disorder: Secondary | ICD-10-CM | POA: Diagnosis not present

## 2015-05-27 DIAGNOSIS — M25512 Pain in left shoulder: Secondary | ICD-10-CM | POA: Diagnosis not present

## 2015-05-27 DIAGNOSIS — Z6828 Body mass index (BMI) 28.0-28.9, adult: Secondary | ICD-10-CM | POA: Diagnosis not present

## 2015-05-27 DIAGNOSIS — M159 Polyosteoarthritis, unspecified: Secondary | ICD-10-CM | POA: Diagnosis not present

## 2015-07-22 DIAGNOSIS — Z6825 Body mass index (BMI) 25.0-25.9, adult: Secondary | ICD-10-CM | POA: Diagnosis not present

## 2015-07-22 DIAGNOSIS — I10 Essential (primary) hypertension: Secondary | ICD-10-CM | POA: Diagnosis not present

## 2015-07-22 DIAGNOSIS — R300 Dysuria: Secondary | ICD-10-CM | POA: Diagnosis not present

## 2015-07-22 DIAGNOSIS — Z1389 Encounter for screening for other disorder: Secondary | ICD-10-CM | POA: Diagnosis not present

## 2015-07-22 DIAGNOSIS — Z9181 History of falling: Secondary | ICD-10-CM | POA: Diagnosis not present

## 2015-07-22 DIAGNOSIS — Z23 Encounter for immunization: Secondary | ICD-10-CM | POA: Diagnosis not present

## 2015-09-14 DIAGNOSIS — Z6826 Body mass index (BMI) 26.0-26.9, adult: Secondary | ICD-10-CM | POA: Diagnosis not present

## 2015-09-14 DIAGNOSIS — M5431 Sciatica, right side: Secondary | ICD-10-CM | POA: Diagnosis not present

## 2015-09-14 DIAGNOSIS — M25551 Pain in right hip: Secondary | ICD-10-CM | POA: Diagnosis not present

## 2015-10-06 DIAGNOSIS — H902 Conductive hearing loss, unspecified: Secondary | ICD-10-CM | POA: Diagnosis not present

## 2015-10-06 DIAGNOSIS — H6122 Impacted cerumen, left ear: Secondary | ICD-10-CM | POA: Diagnosis not present

## 2015-10-06 DIAGNOSIS — Z6826 Body mass index (BMI) 26.0-26.9, adult: Secondary | ICD-10-CM | POA: Diagnosis not present

## 2015-11-17 IMAGING — CR DG CHEST 2V
2 series · 2 of 2 positions shown · non-contrast
Comparison: 12/02/2011

CLINICAL DATA: Preop for lumbar spine surgery. Ex-smoker. History
of hypertension.

EXAM:
CHEST  2 VIEW

[w chest pa]
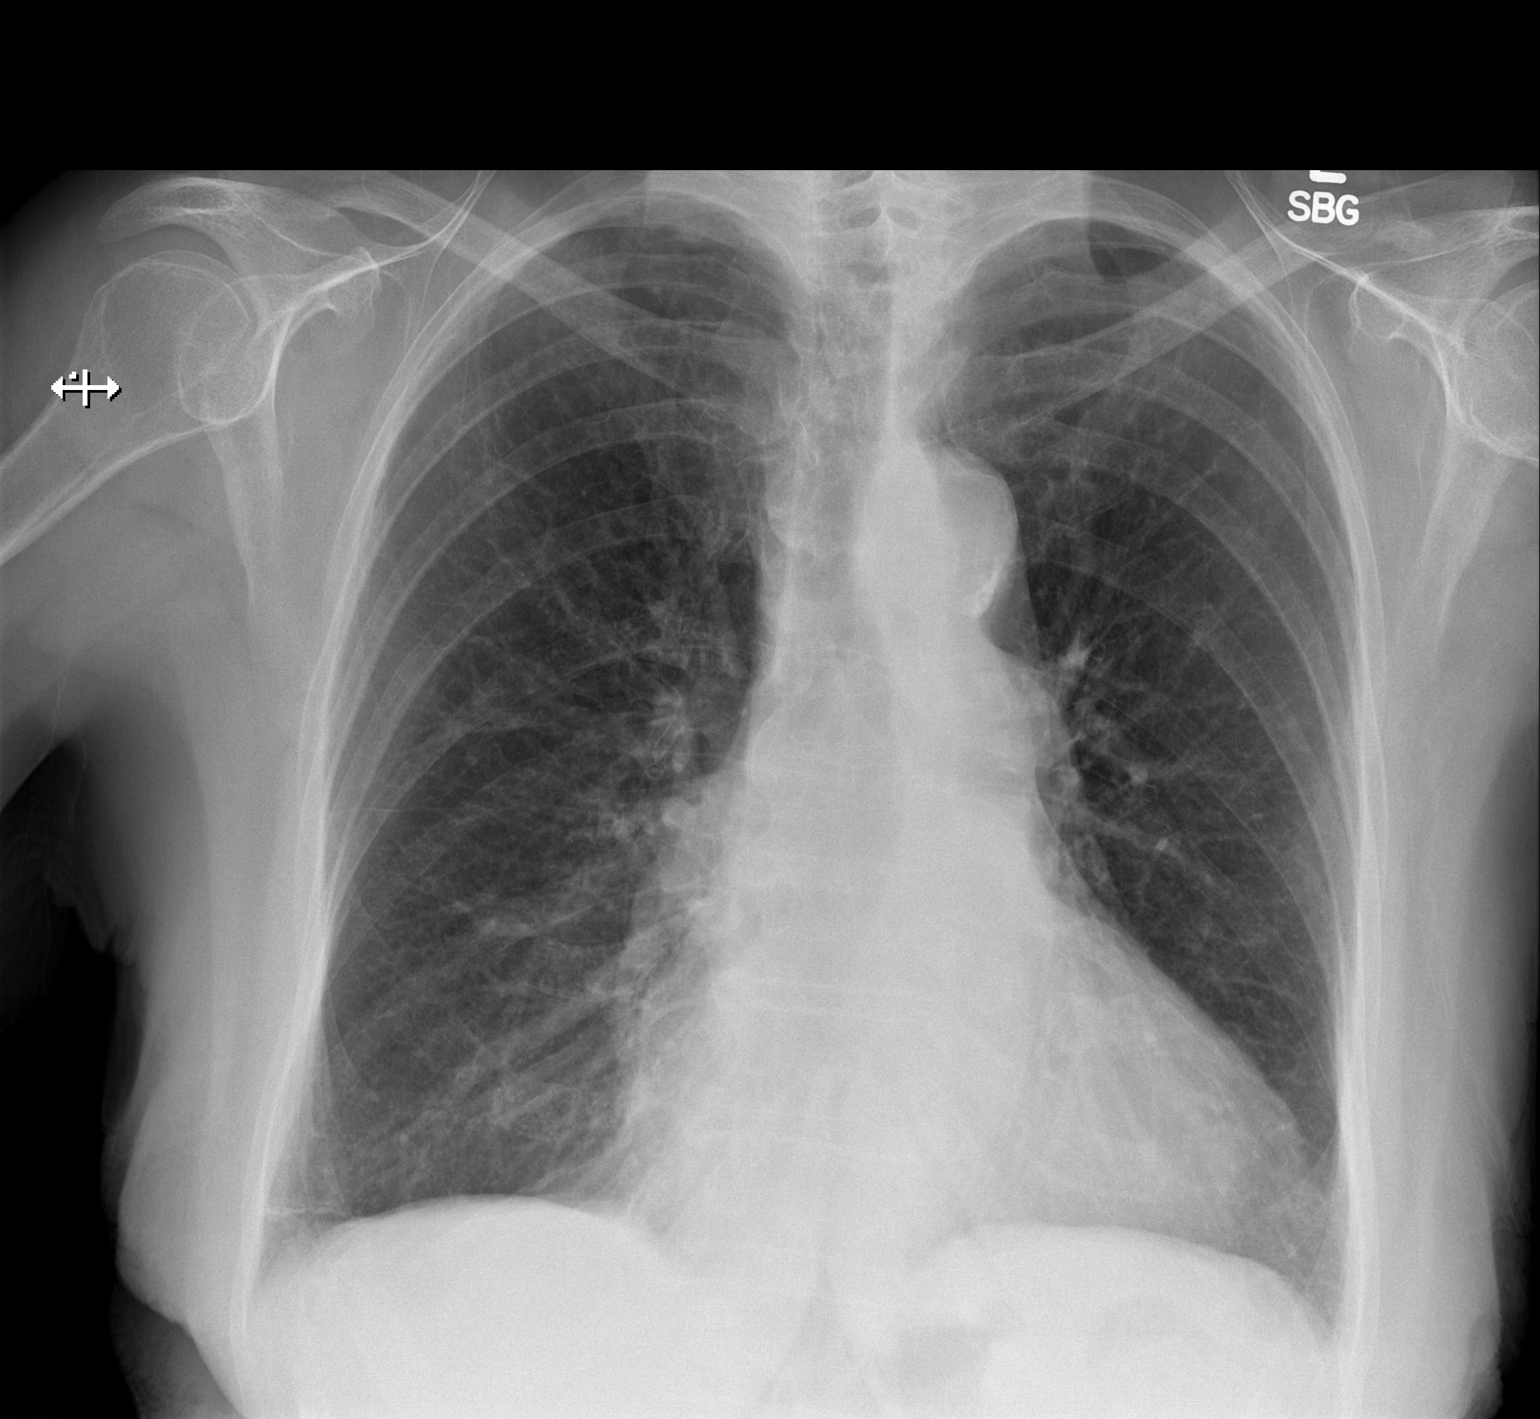

[w chest lat]
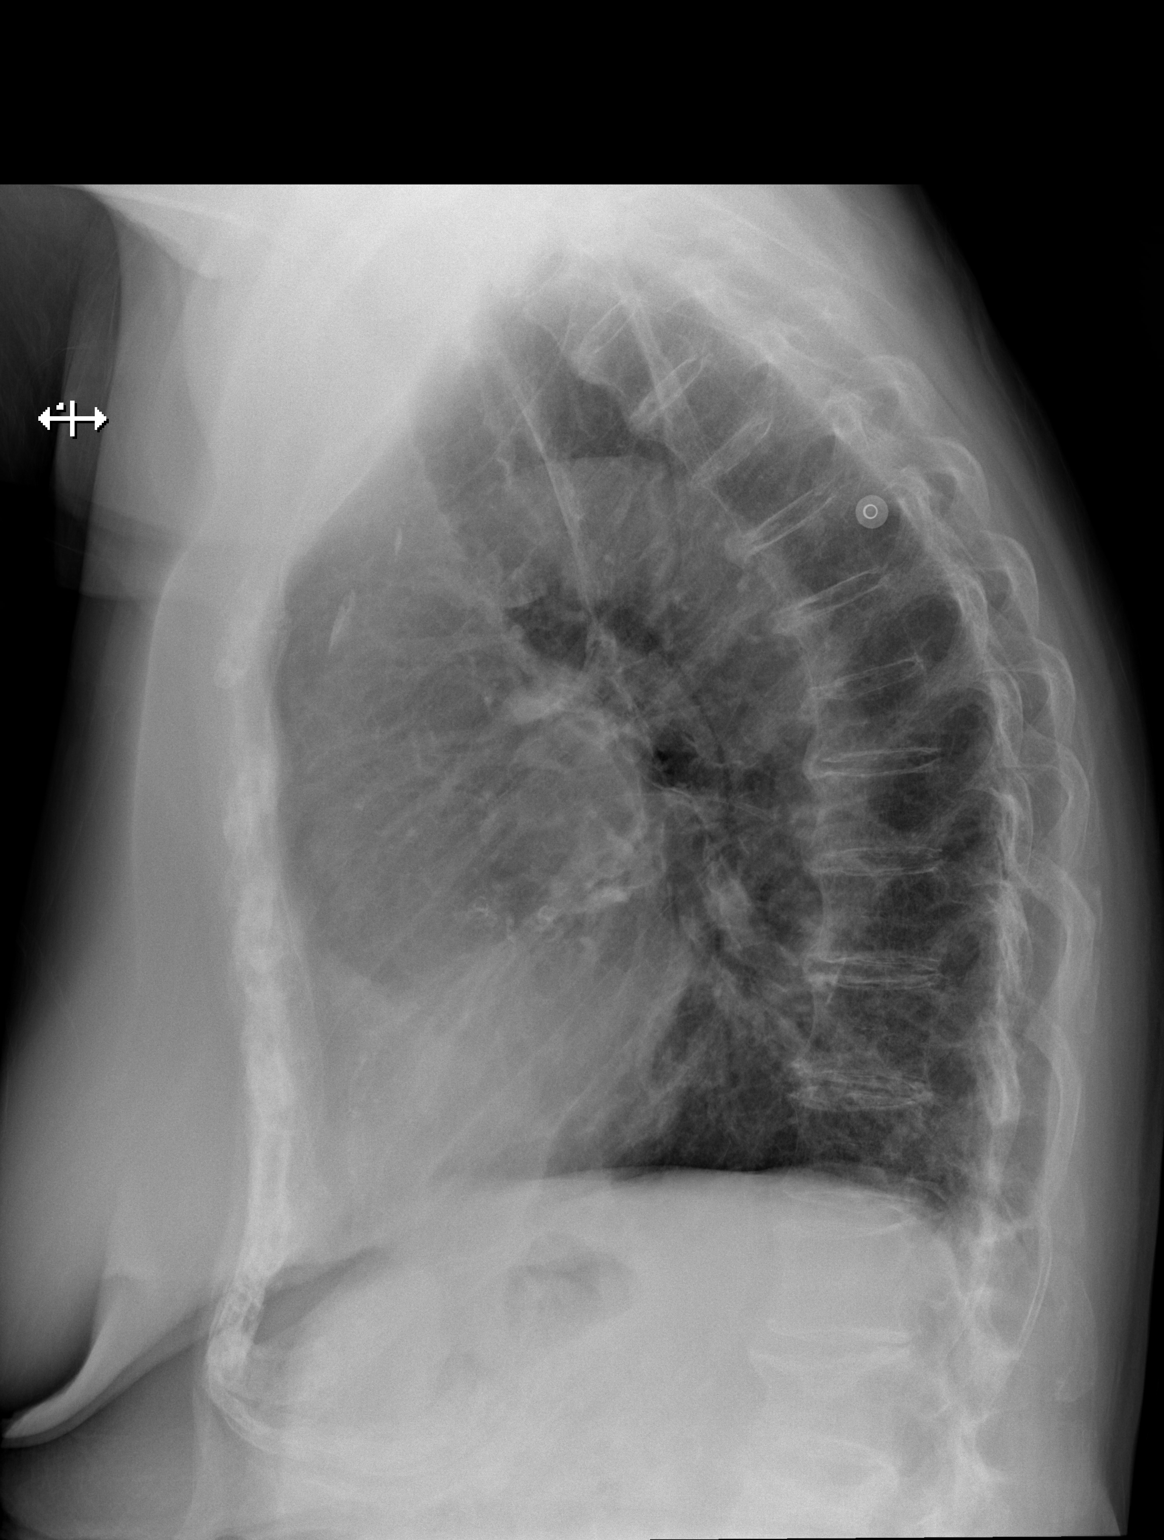

[2 of 2 positions shown; findings below may reference images not displayed]

FINDINGS: Cardiac silhouette is normal in size. Aorta is mildly uncoiled. No
mediastinal or hilar masses. No evidence of adenopathy.

Lungs are hyperexpanded. There is minor reticular scarring or
subsegmental atelectasis the lung bases. Mild apical pleural
parenchymal scarring is noted on the right. Lungs are otherwise
clear. No pleural effusion. No pneumothorax.

The bony thorax is demineralized but intact.
IMPRESSION: No acute cardiopulmonary disease.

## 2015-11-20 IMAGING — DX DG LUMBAR SPINE 1V
1 series · 1 of 1 positions shown · non-contrast
Comparison: 10/22/2013

CLINICAL DATA: L4-S1 surgery.

EXAM:
LUMBAR SPINE - 1 VIEW

[lat]
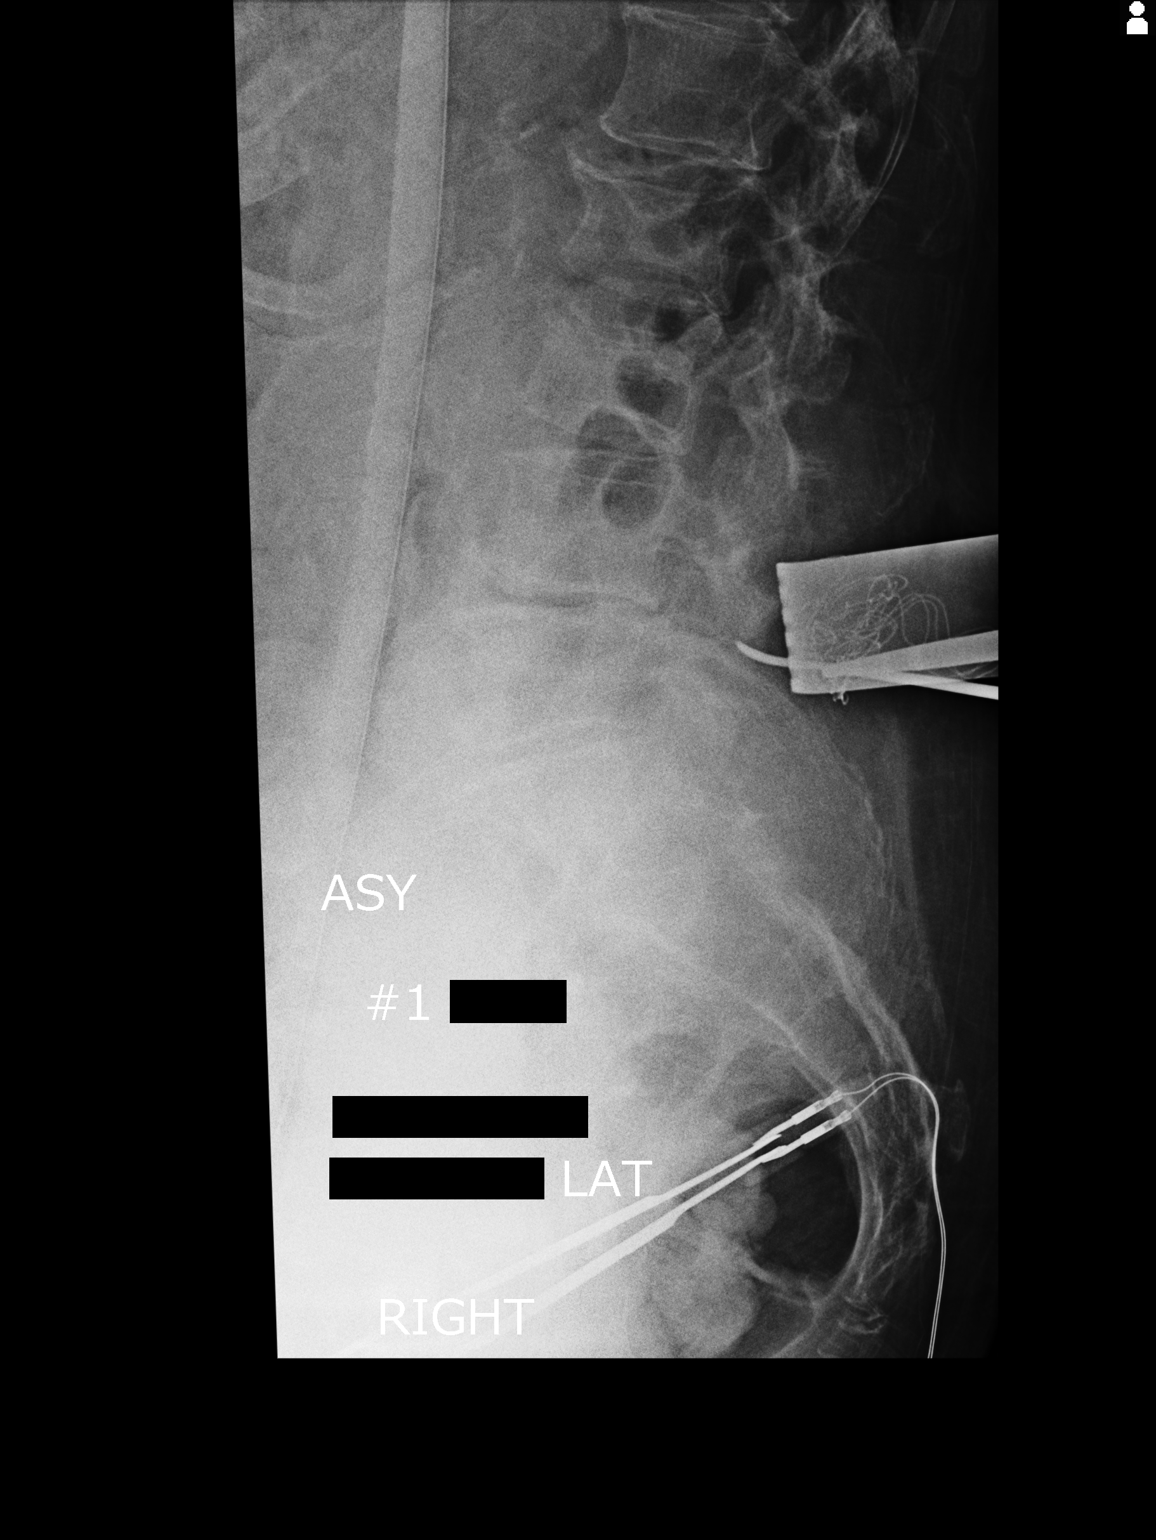

[1 of 1 positions shown; findings below may reference images not displayed]

FINDINGS: Single cross-table lateral view of the lumbar spine demonstrates
posterior surgical instruments directed at the L4-5 level.
IMPRESSION: Intraoperative localization as above.

## 2016-03-04 DIAGNOSIS — E559 Vitamin D deficiency, unspecified: Secondary | ICD-10-CM | POA: Diagnosis not present

## 2016-03-04 DIAGNOSIS — G609 Hereditary and idiopathic neuropathy, unspecified: Secondary | ICD-10-CM | POA: Diagnosis not present

## 2016-03-04 DIAGNOSIS — E538 Deficiency of other specified B group vitamins: Secondary | ICD-10-CM | POA: Diagnosis not present

## 2016-03-04 DIAGNOSIS — I10 Essential (primary) hypertension: Secondary | ICD-10-CM | POA: Diagnosis not present

## 2016-03-04 DIAGNOSIS — E039 Hypothyroidism, unspecified: Secondary | ICD-10-CM | POA: Diagnosis not present

## 2016-03-14 DIAGNOSIS — R69 Illness, unspecified: Secondary | ICD-10-CM | POA: Diagnosis not present

## 2016-03-15 DIAGNOSIS — Z961 Presence of intraocular lens: Secondary | ICD-10-CM | POA: Diagnosis not present

## 2016-03-31 DIAGNOSIS — E871 Hypo-osmolality and hyponatremia: Secondary | ICD-10-CM | POA: Diagnosis not present

## 2016-04-08 DIAGNOSIS — Z23 Encounter for immunization: Secondary | ICD-10-CM | POA: Diagnosis not present

## 2016-07-28 DIAGNOSIS — N390 Urinary tract infection, site not specified: Secondary | ICD-10-CM | POA: Diagnosis not present

## 2016-07-28 DIAGNOSIS — R300 Dysuria: Secondary | ICD-10-CM | POA: Diagnosis not present

## 2016-10-27 DIAGNOSIS — Z6825 Body mass index (BMI) 25.0-25.9, adult: Secondary | ICD-10-CM | POA: Diagnosis not present

## 2016-10-27 DIAGNOSIS — G609 Hereditary and idiopathic neuropathy, unspecified: Secondary | ICD-10-CM | POA: Diagnosis not present

## 2016-11-21 DIAGNOSIS — M542 Cervicalgia: Secondary | ICD-10-CM | POA: Diagnosis not present

## 2016-11-21 DIAGNOSIS — I10 Essential (primary) hypertension: Secondary | ICD-10-CM | POA: Diagnosis not present

## 2016-12-30 DIAGNOSIS — M47812 Spondylosis without myelopathy or radiculopathy, cervical region: Secondary | ICD-10-CM | POA: Diagnosis not present

## 2016-12-30 DIAGNOSIS — R7309 Other abnormal glucose: Secondary | ICD-10-CM | POA: Diagnosis not present

## 2016-12-30 DIAGNOSIS — I10 Essential (primary) hypertension: Secondary | ICD-10-CM | POA: Diagnosis not present

## 2016-12-30 DIAGNOSIS — M542 Cervicalgia: Secondary | ICD-10-CM | POA: Diagnosis not present

## 2016-12-30 DIAGNOSIS — N390 Urinary tract infection, site not specified: Secondary | ICD-10-CM | POA: Diagnosis not present

## 2016-12-30 DIAGNOSIS — R42 Dizziness and giddiness: Secondary | ICD-10-CM | POA: Diagnosis not present

## 2017-01-24 DIAGNOSIS — G894 Chronic pain syndrome: Secondary | ICD-10-CM | POA: Diagnosis not present

## 2017-01-24 DIAGNOSIS — S32000A Wedge compression fracture of unspecified lumbar vertebra, initial encounter for closed fracture: Secondary | ICD-10-CM | POA: Diagnosis not present

## 2017-01-24 DIAGNOSIS — M542 Cervicalgia: Secondary | ICD-10-CM | POA: Diagnosis not present

## 2017-01-24 DIAGNOSIS — M4696 Unspecified inflammatory spondylopathy, lumbar region: Secondary | ICD-10-CM | POA: Diagnosis not present

## 2017-01-24 DIAGNOSIS — M545 Low back pain: Secondary | ICD-10-CM | POA: Diagnosis not present

## 2017-01-24 DIAGNOSIS — M47812 Spondylosis without myelopathy or radiculopathy, cervical region: Secondary | ICD-10-CM | POA: Diagnosis not present

## 2017-01-24 DIAGNOSIS — M961 Postlaminectomy syndrome, not elsewhere classified: Secondary | ICD-10-CM | POA: Diagnosis not present

## 2017-01-24 DIAGNOSIS — M5136 Other intervertebral disc degeneration, lumbar region: Secondary | ICD-10-CM | POA: Diagnosis not present

## 2017-01-30 DIAGNOSIS — N644 Mastodynia: Secondary | ICD-10-CM | POA: Diagnosis not present

## 2017-01-30 DIAGNOSIS — Z6825 Body mass index (BMI) 25.0-25.9, adult: Secondary | ICD-10-CM | POA: Diagnosis not present

## 2017-02-10 DIAGNOSIS — Z87448 Personal history of other diseases of urinary system: Secondary | ICD-10-CM | POA: Diagnosis not present

## 2017-02-10 DIAGNOSIS — E039 Hypothyroidism, unspecified: Secondary | ICD-10-CM | POA: Diagnosis not present

## 2017-02-10 DIAGNOSIS — H9190 Unspecified hearing loss, unspecified ear: Secondary | ICD-10-CM | POA: Diagnosis not present

## 2017-02-10 DIAGNOSIS — Z79899 Other long term (current) drug therapy: Secondary | ICD-10-CM | POA: Diagnosis not present

## 2017-02-10 DIAGNOSIS — M259 Joint disorder, unspecified: Secondary | ICD-10-CM | POA: Diagnosis not present

## 2017-02-10 DIAGNOSIS — M130 Polyarthritis, unspecified: Secondary | ICD-10-CM | POA: Diagnosis not present

## 2017-02-10 DIAGNOSIS — Z79890 Hormone replacement therapy (postmenopausal): Secondary | ICD-10-CM | POA: Diagnosis not present

## 2017-02-10 DIAGNOSIS — Z0000 Encounter for general adult medical examination without abnormal findings: Secondary | ICD-10-CM | POA: Diagnosis not present

## 2017-02-10 DIAGNOSIS — Z6824 Body mass index (BMI) 24.0-24.9, adult: Secondary | ICD-10-CM | POA: Diagnosis not present

## 2017-02-10 DIAGNOSIS — I10 Essential (primary) hypertension: Secondary | ICD-10-CM | POA: Diagnosis not present

## 2017-02-23 DIAGNOSIS — M5136 Other intervertebral disc degeneration, lumbar region: Secondary | ICD-10-CM | POA: Diagnosis not present

## 2017-02-23 DIAGNOSIS — G894 Chronic pain syndrome: Secondary | ICD-10-CM | POA: Diagnosis not present

## 2017-02-23 DIAGNOSIS — M545 Low back pain: Secondary | ICD-10-CM | POA: Diagnosis not present

## 2017-02-23 DIAGNOSIS — M4696 Unspecified inflammatory spondylopathy, lumbar region: Secondary | ICD-10-CM | POA: Diagnosis not present

## 2017-02-23 DIAGNOSIS — M542 Cervicalgia: Secondary | ICD-10-CM | POA: Diagnosis not present

## 2017-02-23 DIAGNOSIS — M961 Postlaminectomy syndrome, not elsewhere classified: Secondary | ICD-10-CM | POA: Diagnosis not present

## 2017-02-23 DIAGNOSIS — S32000A Wedge compression fracture of unspecified lumbar vertebra, initial encounter for closed fracture: Secondary | ICD-10-CM | POA: Diagnosis not present

## 2017-02-23 DIAGNOSIS — M47812 Spondylosis without myelopathy or radiculopathy, cervical region: Secondary | ICD-10-CM | POA: Diagnosis not present

## 2017-03-14 DIAGNOSIS — R69 Illness, unspecified: Secondary | ICD-10-CM | POA: Diagnosis not present

## 2017-03-22 DIAGNOSIS — M545 Low back pain: Secondary | ICD-10-CM | POA: Diagnosis not present

## 2017-03-22 DIAGNOSIS — M5136 Other intervertebral disc degeneration, lumbar region: Secondary | ICD-10-CM | POA: Diagnosis not present

## 2017-03-22 DIAGNOSIS — M47812 Spondylosis without myelopathy or radiculopathy, cervical region: Secondary | ICD-10-CM | POA: Diagnosis not present

## 2017-03-22 DIAGNOSIS — M4696 Unspecified inflammatory spondylopathy, lumbar region: Secondary | ICD-10-CM | POA: Diagnosis not present

## 2017-03-22 DIAGNOSIS — M961 Postlaminectomy syndrome, not elsewhere classified: Secondary | ICD-10-CM | POA: Diagnosis not present

## 2017-03-22 DIAGNOSIS — M542 Cervicalgia: Secondary | ICD-10-CM | POA: Diagnosis not present

## 2017-03-22 DIAGNOSIS — S32000A Wedge compression fracture of unspecified lumbar vertebra, initial encounter for closed fracture: Secondary | ICD-10-CM | POA: Diagnosis not present

## 2017-03-22 DIAGNOSIS — G894 Chronic pain syndrome: Secondary | ICD-10-CM | POA: Diagnosis not present

## 2017-04-04 DIAGNOSIS — R300 Dysuria: Secondary | ICD-10-CM | POA: Diagnosis not present

## 2017-04-04 DIAGNOSIS — N390 Urinary tract infection, site not specified: Secondary | ICD-10-CM | POA: Diagnosis not present

## 2017-04-04 DIAGNOSIS — Z23 Encounter for immunization: Secondary | ICD-10-CM | POA: Diagnosis not present

## 2017-05-19 DIAGNOSIS — J209 Acute bronchitis, unspecified: Secondary | ICD-10-CM | POA: Diagnosis not present

## 2017-06-05 DIAGNOSIS — S46919A Strain of unspecified muscle, fascia and tendon at shoulder and upper arm level, unspecified arm, initial encounter: Secondary | ICD-10-CM | POA: Diagnosis not present

## 2017-06-05 DIAGNOSIS — R0781 Pleurodynia: Secondary | ICD-10-CM | POA: Diagnosis not present

## 2017-06-07 DIAGNOSIS — M159 Polyosteoarthritis, unspecified: Secondary | ICD-10-CM | POA: Diagnosis not present

## 2017-06-07 DIAGNOSIS — Z6825 Body mass index (BMI) 25.0-25.9, adult: Secondary | ICD-10-CM | POA: Diagnosis not present

## 2017-06-07 DIAGNOSIS — M25512 Pain in left shoulder: Secondary | ICD-10-CM | POA: Diagnosis not present

## 2017-06-14 DIAGNOSIS — M25511 Pain in right shoulder: Secondary | ICD-10-CM | POA: Diagnosis not present

## 2017-06-14 DIAGNOSIS — Z6825 Body mass index (BMI) 25.0-25.9, adult: Secondary | ICD-10-CM | POA: Diagnosis not present

## 2017-06-14 DIAGNOSIS — M159 Polyosteoarthritis, unspecified: Secondary | ICD-10-CM | POA: Diagnosis not present

## 2017-06-29 DIAGNOSIS — E663 Overweight: Secondary | ICD-10-CM | POA: Diagnosis not present

## 2017-06-29 DIAGNOSIS — Z6825 Body mass index (BMI) 25.0-25.9, adult: Secondary | ICD-10-CM | POA: Diagnosis not present

## 2017-06-29 DIAGNOSIS — M25511 Pain in right shoulder: Secondary | ICD-10-CM | POA: Diagnosis not present

## 2017-06-29 DIAGNOSIS — M159 Polyosteoarthritis, unspecified: Secondary | ICD-10-CM | POA: Diagnosis not present

## 2017-07-24 DIAGNOSIS — R300 Dysuria: Secondary | ICD-10-CM | POA: Diagnosis not present

## 2017-07-24 DIAGNOSIS — N390 Urinary tract infection, site not specified: Secondary | ICD-10-CM | POA: Diagnosis not present

## 2017-09-26 DIAGNOSIS — M159 Polyosteoarthritis, unspecified: Secondary | ICD-10-CM | POA: Diagnosis not present

## 2017-09-26 DIAGNOSIS — Z6824 Body mass index (BMI) 24.0-24.9, adult: Secondary | ICD-10-CM | POA: Diagnosis not present

## 2017-09-26 DIAGNOSIS — M25511 Pain in right shoulder: Secondary | ICD-10-CM | POA: Diagnosis not present

## 2017-10-03 DIAGNOSIS — M159 Polyosteoarthritis, unspecified: Secondary | ICD-10-CM | POA: Diagnosis not present

## 2017-10-03 DIAGNOSIS — M25511 Pain in right shoulder: Secondary | ICD-10-CM | POA: Diagnosis not present

## 2017-10-03 DIAGNOSIS — Z6824 Body mass index (BMI) 24.0-24.9, adult: Secondary | ICD-10-CM | POA: Diagnosis not present

## 2017-11-02 DIAGNOSIS — M1990 Unspecified osteoarthritis, unspecified site: Secondary | ICD-10-CM | POA: Diagnosis not present

## 2017-11-02 DIAGNOSIS — R32 Unspecified urinary incontinence: Secondary | ICD-10-CM | POA: Diagnosis not present

## 2017-11-02 DIAGNOSIS — E039 Hypothyroidism, unspecified: Secondary | ICD-10-CM | POA: Diagnosis not present

## 2017-11-02 DIAGNOSIS — R69 Illness, unspecified: Secondary | ICD-10-CM | POA: Diagnosis not present

## 2017-11-02 DIAGNOSIS — Z809 Family history of malignant neoplasm, unspecified: Secondary | ICD-10-CM | POA: Diagnosis not present

## 2017-11-02 DIAGNOSIS — Z823 Family history of stroke: Secondary | ICD-10-CM | POA: Diagnosis not present

## 2017-11-02 DIAGNOSIS — K5900 Constipation, unspecified: Secondary | ICD-10-CM | POA: Diagnosis not present

## 2017-11-02 DIAGNOSIS — M810 Age-related osteoporosis without current pathological fracture: Secondary | ICD-10-CM | POA: Diagnosis not present

## 2017-11-02 DIAGNOSIS — I10 Essential (primary) hypertension: Secondary | ICD-10-CM | POA: Diagnosis not present

## 2017-11-02 DIAGNOSIS — G8929 Other chronic pain: Secondary | ICD-10-CM | POA: Diagnosis not present

## 2017-11-14 DIAGNOSIS — R5381 Other malaise: Secondary | ICD-10-CM | POA: Diagnosis not present

## 2017-11-14 DIAGNOSIS — I10 Essential (primary) hypertension: Secondary | ICD-10-CM | POA: Diagnosis not present

## 2017-12-07 DIAGNOSIS — R5381 Other malaise: Secondary | ICD-10-CM | POA: Diagnosis not present

## 2017-12-07 DIAGNOSIS — Z9181 History of falling: Secondary | ICD-10-CM | POA: Diagnosis not present

## 2017-12-07 DIAGNOSIS — Z1331 Encounter for screening for depression: Secondary | ICD-10-CM | POA: Diagnosis not present

## 2017-12-07 DIAGNOSIS — N390 Urinary tract infection, site not specified: Secondary | ICD-10-CM | POA: Diagnosis not present

## 2017-12-14 DIAGNOSIS — Z6823 Body mass index (BMI) 23.0-23.9, adult: Secondary | ICD-10-CM | POA: Diagnosis not present

## 2017-12-14 DIAGNOSIS — M159 Polyosteoarthritis, unspecified: Secondary | ICD-10-CM | POA: Diagnosis not present

## 2017-12-14 DIAGNOSIS — M25511 Pain in right shoulder: Secondary | ICD-10-CM | POA: Diagnosis not present

## 2017-12-25 DIAGNOSIS — Z6823 Body mass index (BMI) 23.0-23.9, adult: Secondary | ICD-10-CM | POA: Diagnosis not present

## 2017-12-25 DIAGNOSIS — M25511 Pain in right shoulder: Secondary | ICD-10-CM | POA: Diagnosis not present

## 2017-12-25 DIAGNOSIS — M159 Polyosteoarthritis, unspecified: Secondary | ICD-10-CM | POA: Diagnosis not present

## 2018-01-08 DIAGNOSIS — B3789 Other sites of candidiasis: Secondary | ICD-10-CM | POA: Diagnosis not present

## 2018-01-08 DIAGNOSIS — Z6823 Body mass index (BMI) 23.0-23.9, adult: Secondary | ICD-10-CM | POA: Diagnosis not present

## 2018-02-08 DIAGNOSIS — Z136 Encounter for screening for cardiovascular disorders: Secondary | ICD-10-CM | POA: Diagnosis not present

## 2018-02-08 DIAGNOSIS — Z0000 Encounter for general adult medical examination without abnormal findings: Secondary | ICD-10-CM | POA: Diagnosis not present

## 2018-02-08 DIAGNOSIS — Z1339 Encounter for screening examination for other mental health and behavioral disorders: Secondary | ICD-10-CM | POA: Diagnosis not present

## 2018-02-08 DIAGNOSIS — Z9181 History of falling: Secondary | ICD-10-CM | POA: Diagnosis not present

## 2018-02-08 DIAGNOSIS — R5381 Other malaise: Secondary | ICD-10-CM | POA: Diagnosis not present

## 2018-02-08 DIAGNOSIS — M159 Polyosteoarthritis, unspecified: Secondary | ICD-10-CM | POA: Diagnosis not present

## 2018-02-08 DIAGNOSIS — I10 Essential (primary) hypertension: Secondary | ICD-10-CM | POA: Diagnosis not present

## 2018-02-08 DIAGNOSIS — E785 Hyperlipidemia, unspecified: Secondary | ICD-10-CM | POA: Diagnosis not present

## 2018-02-08 DIAGNOSIS — Z1331 Encounter for screening for depression: Secondary | ICD-10-CM | POA: Diagnosis not present

## 2018-02-08 DIAGNOSIS — E039 Hypothyroidism, unspecified: Secondary | ICD-10-CM | POA: Diagnosis not present

## 2018-02-12 DIAGNOSIS — M159 Polyosteoarthritis, unspecified: Secondary | ICD-10-CM | POA: Diagnosis not present

## 2018-02-12 DIAGNOSIS — M25511 Pain in right shoulder: Secondary | ICD-10-CM | POA: Diagnosis not present

## 2018-02-12 DIAGNOSIS — Z6822 Body mass index (BMI) 22.0-22.9, adult: Secondary | ICD-10-CM | POA: Diagnosis not present

## 2018-02-26 DIAGNOSIS — M159 Polyosteoarthritis, unspecified: Secondary | ICD-10-CM | POA: Diagnosis not present

## 2018-02-26 DIAGNOSIS — Z6822 Body mass index (BMI) 22.0-22.9, adult: Secondary | ICD-10-CM | POA: Diagnosis not present

## 2018-02-26 DIAGNOSIS — M542 Cervicalgia: Secondary | ICD-10-CM | POA: Diagnosis not present

## 2018-03-27 DIAGNOSIS — H35373 Puckering of macula, bilateral: Secondary | ICD-10-CM | POA: Diagnosis not present

## 2018-03-27 DIAGNOSIS — H35363 Drusen (degenerative) of macula, bilateral: Secondary | ICD-10-CM | POA: Diagnosis not present

## 2018-03-27 DIAGNOSIS — Z961 Presence of intraocular lens: Secondary | ICD-10-CM | POA: Diagnosis not present

## 2018-04-05 DIAGNOSIS — Z23 Encounter for immunization: Secondary | ICD-10-CM | POA: Diagnosis not present

## 2018-04-05 DIAGNOSIS — M542 Cervicalgia: Secondary | ICD-10-CM | POA: Diagnosis not present

## 2018-04-05 DIAGNOSIS — Z6822 Body mass index (BMI) 22.0-22.9, adult: Secondary | ICD-10-CM | POA: Diagnosis not present

## 2018-04-19 DIAGNOSIS — M47812 Spondylosis without myelopathy or radiculopathy, cervical region: Secondary | ICD-10-CM | POA: Diagnosis not present

## 2018-04-19 DIAGNOSIS — M542 Cervicalgia: Secondary | ICD-10-CM | POA: Diagnosis not present

## 2018-05-14 DIAGNOSIS — G609 Hereditary and idiopathic neuropathy, unspecified: Secondary | ICD-10-CM | POA: Diagnosis not present

## 2018-05-14 DIAGNOSIS — I10 Essential (primary) hypertension: Secondary | ICD-10-CM | POA: Diagnosis not present

## 2018-05-14 DIAGNOSIS — K5909 Other constipation: Secondary | ICD-10-CM | POA: Diagnosis not present

## 2018-05-14 DIAGNOSIS — M4802 Spinal stenosis, cervical region: Secondary | ICD-10-CM | POA: Diagnosis not present

## 2018-05-14 DIAGNOSIS — N390 Urinary tract infection, site not specified: Secondary | ICD-10-CM | POA: Diagnosis not present

## 2018-05-14 DIAGNOSIS — E039 Hypothyroidism, unspecified: Secondary | ICD-10-CM | POA: Diagnosis not present

## 2018-05-14 DIAGNOSIS — M159 Polyosteoarthritis, unspecified: Secondary | ICD-10-CM | POA: Diagnosis not present

## 2018-05-14 DIAGNOSIS — Z6823 Body mass index (BMI) 23.0-23.9, adult: Secondary | ICD-10-CM | POA: Diagnosis not present

## 2018-07-13 DIAGNOSIS — K5909 Other constipation: Secondary | ICD-10-CM | POA: Diagnosis not present

## 2018-07-13 DIAGNOSIS — M4802 Spinal stenosis, cervical region: Secondary | ICD-10-CM | POA: Diagnosis not present

## 2018-07-13 DIAGNOSIS — Z6823 Body mass index (BMI) 23.0-23.9, adult: Secondary | ICD-10-CM | POA: Diagnosis not present

## 2018-07-23 DIAGNOSIS — Z6823 Body mass index (BMI) 23.0-23.9, adult: Secondary | ICD-10-CM | POA: Diagnosis not present

## 2018-07-23 DIAGNOSIS — S0003XA Contusion of scalp, initial encounter: Secondary | ICD-10-CM | POA: Diagnosis not present

## 2018-07-23 DIAGNOSIS — M542 Cervicalgia: Secondary | ICD-10-CM | POA: Diagnosis not present

## 2018-07-24 DIAGNOSIS — M47812 Spondylosis without myelopathy or radiculopathy, cervical region: Secondary | ICD-10-CM | POA: Diagnosis not present

## 2018-07-24 DIAGNOSIS — M542 Cervicalgia: Secondary | ICD-10-CM | POA: Diagnosis not present

## 2018-07-24 DIAGNOSIS — M4802 Spinal stenosis, cervical region: Secondary | ICD-10-CM | POA: Diagnosis not present

## 2018-07-24 DIAGNOSIS — G8929 Other chronic pain: Secondary | ICD-10-CM | POA: Diagnosis not present

## 2018-07-30 DIAGNOSIS — M47812 Spondylosis without myelopathy or radiculopathy, cervical region: Secondary | ICD-10-CM | POA: Diagnosis not present

## 2018-07-30 DIAGNOSIS — M4802 Spinal stenosis, cervical region: Secondary | ICD-10-CM | POA: Diagnosis not present

## 2018-08-06 DIAGNOSIS — M4802 Spinal stenosis, cervical region: Secondary | ICD-10-CM | POA: Diagnosis not present

## 2018-08-06 DIAGNOSIS — Z6823 Body mass index (BMI) 23.0-23.9, adult: Secondary | ICD-10-CM | POA: Diagnosis not present

## 2018-08-06 DIAGNOSIS — N390 Urinary tract infection, site not specified: Secondary | ICD-10-CM | POA: Diagnosis not present

## 2018-08-06 DIAGNOSIS — R300 Dysuria: Secondary | ICD-10-CM | POA: Diagnosis not present

## 2018-11-22 DIAGNOSIS — I10 Essential (primary) hypertension: Secondary | ICD-10-CM | POA: Diagnosis not present

## 2018-11-22 DIAGNOSIS — Z6824 Body mass index (BMI) 24.0-24.9, adult: Secondary | ICD-10-CM | POA: Diagnosis not present

## 2018-11-22 DIAGNOSIS — S46912A Strain of unspecified muscle, fascia and tendon at shoulder and upper arm level, left arm, initial encounter: Secondary | ICD-10-CM | POA: Diagnosis not present

## 2018-12-11 DIAGNOSIS — R600 Localized edema: Secondary | ICD-10-CM | POA: Diagnosis not present

## 2018-12-11 DIAGNOSIS — M898X1 Other specified disorders of bone, shoulder: Secondary | ICD-10-CM | POA: Diagnosis not present

## 2018-12-11 DIAGNOSIS — Z6824 Body mass index (BMI) 24.0-24.9, adult: Secondary | ICD-10-CM | POA: Diagnosis not present

## 2018-12-11 DIAGNOSIS — M542 Cervicalgia: Secondary | ICD-10-CM | POA: Diagnosis not present

## 2018-12-12 DIAGNOSIS — M159 Polyosteoarthritis, unspecified: Secondary | ICD-10-CM | POA: Diagnosis not present

## 2018-12-12 DIAGNOSIS — E663 Overweight: Secondary | ICD-10-CM | POA: Diagnosis not present

## 2018-12-12 DIAGNOSIS — M25512 Pain in left shoulder: Secondary | ICD-10-CM | POA: Diagnosis not present

## 2018-12-12 DIAGNOSIS — Z6825 Body mass index (BMI) 25.0-25.9, adult: Secondary | ICD-10-CM | POA: Diagnosis not present

## 2018-12-26 DIAGNOSIS — M159 Polyosteoarthritis, unspecified: Secondary | ICD-10-CM | POA: Diagnosis not present

## 2018-12-26 DIAGNOSIS — Z6823 Body mass index (BMI) 23.0-23.9, adult: Secondary | ICD-10-CM | POA: Diagnosis not present

## 2018-12-26 DIAGNOSIS — E663 Overweight: Secondary | ICD-10-CM | POA: Diagnosis not present

## 2018-12-26 DIAGNOSIS — M25512 Pain in left shoulder: Secondary | ICD-10-CM | POA: Diagnosis not present

## 2019-01-09 DIAGNOSIS — Z6823 Body mass index (BMI) 23.0-23.9, adult: Secondary | ICD-10-CM | POA: Diagnosis not present

## 2019-01-09 DIAGNOSIS — M25512 Pain in left shoulder: Secondary | ICD-10-CM | POA: Diagnosis not present

## 2019-01-09 DIAGNOSIS — M159 Polyosteoarthritis, unspecified: Secondary | ICD-10-CM | POA: Diagnosis not present

## 2019-01-22 DIAGNOSIS — K5909 Other constipation: Secondary | ICD-10-CM | POA: Diagnosis not present

## 2019-01-22 DIAGNOSIS — K8020 Calculus of gallbladder without cholecystitis without obstruction: Secondary | ICD-10-CM | POA: Diagnosis not present

## 2019-01-22 DIAGNOSIS — R350 Frequency of micturition: Secondary | ICD-10-CM | POA: Diagnosis not present

## 2019-01-22 DIAGNOSIS — Z6823 Body mass index (BMI) 23.0-23.9, adult: Secondary | ICD-10-CM | POA: Diagnosis not present

## 2019-01-22 DIAGNOSIS — K5900 Constipation, unspecified: Secondary | ICD-10-CM | POA: Diagnosis not present

## 2019-01-22 DIAGNOSIS — M545 Low back pain: Secondary | ICD-10-CM | POA: Diagnosis not present

## 2019-01-22 DIAGNOSIS — N390 Urinary tract infection, site not specified: Secondary | ICD-10-CM | POA: Diagnosis not present

## 2019-01-22 DIAGNOSIS — R109 Unspecified abdominal pain: Secondary | ICD-10-CM | POA: Diagnosis not present

## 2019-01-23 DIAGNOSIS — Z6823 Body mass index (BMI) 23.0-23.9, adult: Secondary | ICD-10-CM | POA: Diagnosis not present

## 2019-01-23 DIAGNOSIS — M159 Polyosteoarthritis, unspecified: Secondary | ICD-10-CM | POA: Diagnosis not present

## 2019-01-23 DIAGNOSIS — M25511 Pain in right shoulder: Secondary | ICD-10-CM | POA: Diagnosis not present

## 2019-01-27 DIAGNOSIS — R52 Pain, unspecified: Secondary | ICD-10-CM | POA: Diagnosis not present

## 2019-01-27 DIAGNOSIS — E871 Hypo-osmolality and hyponatremia: Secondary | ICD-10-CM | POA: Diagnosis not present

## 2019-01-27 DIAGNOSIS — K8020 Calculus of gallbladder without cholecystitis without obstruction: Secondary | ICD-10-CM | POA: Diagnosis not present

## 2019-01-27 DIAGNOSIS — K5900 Constipation, unspecified: Secondary | ICD-10-CM | POA: Diagnosis not present

## 2019-01-27 DIAGNOSIS — K5730 Diverticulosis of large intestine without perforation or abscess without bleeding: Secondary | ICD-10-CM | POA: Diagnosis not present

## 2019-01-27 DIAGNOSIS — R001 Bradycardia, unspecified: Secondary | ICD-10-CM | POA: Diagnosis not present

## 2019-01-27 DIAGNOSIS — M5489 Other dorsalgia: Secondary | ICD-10-CM | POA: Diagnosis not present

## 2019-01-31 DIAGNOSIS — R109 Unspecified abdominal pain: Secondary | ICD-10-CM | POA: Diagnosis not present

## 2019-01-31 DIAGNOSIS — I10 Essential (primary) hypertension: Secondary | ICD-10-CM | POA: Diagnosis not present

## 2019-01-31 DIAGNOSIS — K5909 Other constipation: Secondary | ICD-10-CM | POA: Diagnosis not present

## 2019-01-31 DIAGNOSIS — Z139 Encounter for screening, unspecified: Secondary | ICD-10-CM | POA: Diagnosis not present

## 2019-01-31 DIAGNOSIS — M159 Polyosteoarthritis, unspecified: Secondary | ICD-10-CM | POA: Diagnosis not present

## 2019-01-31 DIAGNOSIS — M545 Low back pain: Secondary | ICD-10-CM | POA: Diagnosis not present

## 2019-01-31 DIAGNOSIS — E871 Hypo-osmolality and hyponatremia: Secondary | ICD-10-CM | POA: Diagnosis not present

## 2019-01-31 DIAGNOSIS — Z6822 Body mass index (BMI) 22.0-22.9, adult: Secondary | ICD-10-CM | POA: Diagnosis not present

## 2019-02-07 DIAGNOSIS — M545 Low back pain: Secondary | ICD-10-CM | POA: Diagnosis not present

## 2019-02-12 DIAGNOSIS — Z1331 Encounter for screening for depression: Secondary | ICD-10-CM | POA: Diagnosis not present

## 2019-02-12 DIAGNOSIS — Z0000 Encounter for general adult medical examination without abnormal findings: Secondary | ICD-10-CM | POA: Diagnosis not present

## 2019-02-12 DIAGNOSIS — Z1231 Encounter for screening mammogram for malignant neoplasm of breast: Secondary | ICD-10-CM | POA: Diagnosis not present

## 2019-02-12 DIAGNOSIS — N959 Unspecified menopausal and perimenopausal disorder: Secondary | ICD-10-CM | POA: Diagnosis not present

## 2019-02-12 DIAGNOSIS — Z9181 History of falling: Secondary | ICD-10-CM | POA: Diagnosis not present

## 2019-02-12 DIAGNOSIS — E785 Hyperlipidemia, unspecified: Secondary | ICD-10-CM | POA: Diagnosis not present

## 2019-02-14 DIAGNOSIS — R109 Unspecified abdominal pain: Secondary | ICD-10-CM | POA: Diagnosis not present

## 2019-02-21 DIAGNOSIS — K8020 Calculus of gallbladder without cholecystitis without obstruction: Secondary | ICD-10-CM | POA: Diagnosis not present

## 2019-02-21 DIAGNOSIS — S32019A Unspecified fracture of first lumbar vertebra, initial encounter for closed fracture: Secondary | ICD-10-CM | POA: Diagnosis not present

## 2019-02-21 DIAGNOSIS — N200 Calculus of kidney: Secondary | ICD-10-CM | POA: Diagnosis not present

## 2019-02-21 DIAGNOSIS — X58XXXA Exposure to other specified factors, initial encounter: Secondary | ICD-10-CM | POA: Diagnosis not present

## 2019-02-21 DIAGNOSIS — R0902 Hypoxemia: Secondary | ICD-10-CM | POA: Diagnosis not present

## 2019-02-21 DIAGNOSIS — S32030A Wedge compression fracture of third lumbar vertebra, initial encounter for closed fracture: Secondary | ICD-10-CM | POA: Diagnosis not present

## 2019-02-21 DIAGNOSIS — Z03818 Encounter for observation for suspected exposure to other biological agents ruled out: Secondary | ICD-10-CM | POA: Diagnosis not present

## 2019-02-21 DIAGNOSIS — R0781 Pleurodynia: Secondary | ICD-10-CM | POA: Diagnosis not present

## 2019-02-21 DIAGNOSIS — S32020A Wedge compression fracture of second lumbar vertebra, initial encounter for closed fracture: Secondary | ICD-10-CM | POA: Diagnosis not present

## 2019-02-21 DIAGNOSIS — J90 Pleural effusion, not elsewhere classified: Secondary | ICD-10-CM | POA: Diagnosis not present

## 2019-02-21 DIAGNOSIS — R230 Cyanosis: Secondary | ICD-10-CM | POA: Diagnosis not present

## 2019-02-21 DIAGNOSIS — R52 Pain, unspecified: Secondary | ICD-10-CM | POA: Diagnosis not present

## 2019-02-22 DIAGNOSIS — Z7189 Other specified counseling: Secondary | ICD-10-CM | POA: Diagnosis not present

## 2019-02-22 DIAGNOSIS — J90 Pleural effusion, not elsewhere classified: Secondary | ICD-10-CM | POA: Diagnosis not present

## 2019-02-22 DIAGNOSIS — S32010A Wedge compression fracture of first lumbar vertebra, initial encounter for closed fracture: Secondary | ICD-10-CM | POA: Diagnosis not present

## 2019-02-22 DIAGNOSIS — M545 Low back pain: Secondary | ICD-10-CM | POA: Diagnosis not present

## 2019-02-22 DIAGNOSIS — R0902 Hypoxemia: Secondary | ICD-10-CM | POA: Diagnosis not present

## 2019-02-26 DIAGNOSIS — R0902 Hypoxemia: Secondary | ICD-10-CM | POA: Diagnosis not present

## 2019-02-26 DIAGNOSIS — M545 Low back pain: Secondary | ICD-10-CM | POA: Diagnosis not present

## 2019-02-26 DIAGNOSIS — S32010S Wedge compression fracture of first lumbar vertebra, sequela: Secondary | ICD-10-CM | POA: Diagnosis not present

## 2019-02-28 DIAGNOSIS — H902 Conductive hearing loss, unspecified: Secondary | ICD-10-CM | POA: Diagnosis not present

## 2019-02-28 DIAGNOSIS — I10 Essential (primary) hypertension: Secondary | ICD-10-CM | POA: Diagnosis not present

## 2019-02-28 DIAGNOSIS — G609 Hereditary and idiopathic neuropathy, unspecified: Secondary | ICD-10-CM | POA: Diagnosis not present

## 2019-02-28 DIAGNOSIS — E785 Hyperlipidemia, unspecified: Secondary | ICD-10-CM | POA: Diagnosis not present

## 2019-02-28 DIAGNOSIS — M5136 Other intervertebral disc degeneration, lumbar region: Secondary | ICD-10-CM | POA: Diagnosis not present

## 2019-02-28 DIAGNOSIS — E039 Hypothyroidism, unspecified: Secondary | ICD-10-CM | POA: Diagnosis not present

## 2019-02-28 DIAGNOSIS — G4700 Insomnia, unspecified: Secondary | ICD-10-CM | POA: Diagnosis not present

## 2019-02-28 DIAGNOSIS — M5126 Other intervertebral disc displacement, lumbar region: Secondary | ICD-10-CM | POA: Diagnosis not present

## 2019-02-28 DIAGNOSIS — G8929 Other chronic pain: Secondary | ICD-10-CM | POA: Diagnosis not present

## 2019-02-28 DIAGNOSIS — S32010S Wedge compression fracture of first lumbar vertebra, sequela: Secondary | ICD-10-CM | POA: Diagnosis not present

## 2019-03-21 DEATH — deceased
